# Patient Record
Sex: Female | Born: 1960 | Race: White | Hispanic: No | Smoking: Never smoker
Health system: Southern US, Community
[De-identification: ages and names within clinical notes are randomized; demographics above are authoritative.]

## PROBLEM LIST (undated history)

## (undated) DIAGNOSIS — D649 Anemia, unspecified: Secondary | ICD-10-CM

## (undated) DIAGNOSIS — Z5189 Encounter for other specified aftercare: Secondary | ICD-10-CM

## (undated) DIAGNOSIS — F329 Major depressive disorder, single episode, unspecified: Secondary | ICD-10-CM

## (undated) DIAGNOSIS — F32A Depression, unspecified: Secondary | ICD-10-CM

## (undated) DIAGNOSIS — F419 Anxiety disorder, unspecified: Secondary | ICD-10-CM

## (undated) HISTORY — PX: ABDOMINAL SURGERY: SHX537

## (undated) HISTORY — PX: NECK SURGERY: SHX720

## (undated) HISTORY — PX: TUBAL LIGATION: SHX77

## (undated) HISTORY — PX: CHOLECYSTECTOMY: SHX55

## (undated) HISTORY — PX: BACK SURGERY: SHX140

---

## 2013-10-13 DIAGNOSIS — Z5189 Encounter for other specified aftercare: Secondary | ICD-10-CM

## 2013-10-13 HISTORY — DX: Encounter for other specified aftercare: Z51.89

## 2013-11-06 ENCOUNTER — Emergency Department (HOSPITAL_COMMUNITY)
Admission: EM | Admit: 2013-11-06 | Discharge: 2013-11-07 | Disposition: A | Discharge status: Other Acute Inpatient Hospital | Payer: BC Managed Care – PPO | Attending: Emergency Medicine | Admitting: Emergency Medicine

## 2013-11-06 ENCOUNTER — Encounter (HOSPITAL_COMMUNITY): Payer: Self-pay | Admitting: Emergency Medicine

## 2013-11-06 DIAGNOSIS — F10939 Alcohol use, unspecified with withdrawal, unspecified: Secondary | ICD-10-CM | POA: Insufficient documentation

## 2013-11-06 DIAGNOSIS — F1910 Other psychoactive substance abuse, uncomplicated: Secondary | ICD-10-CM | POA: Insufficient documentation

## 2013-11-06 DIAGNOSIS — Z79899 Other long term (current) drug therapy: Secondary | ICD-10-CM | POA: Insufficient documentation

## 2013-11-06 DIAGNOSIS — F458 Other somatoform disorders: Secondary | ICD-10-CM | POA: Insufficient documentation

## 2013-11-06 DIAGNOSIS — F10239 Alcohol dependence with withdrawal, unspecified: Secondary | ICD-10-CM | POA: Insufficient documentation

## 2013-11-06 DIAGNOSIS — Z3202 Encounter for pregnancy test, result negative: Secondary | ICD-10-CM | POA: Insufficient documentation

## 2013-11-06 DIAGNOSIS — R45851 Suicidal ideations: Secondary | ICD-10-CM | POA: Insufficient documentation

## 2013-11-06 DIAGNOSIS — F32A Depression, unspecified: Secondary | ICD-10-CM

## 2013-11-06 DIAGNOSIS — F3289 Other specified depressive episodes: Secondary | ICD-10-CM | POA: Insufficient documentation

## 2013-11-06 DIAGNOSIS — F332 Major depressive disorder, recurrent severe without psychotic features: Secondary | ICD-10-CM | POA: Diagnosis present

## 2013-11-06 DIAGNOSIS — F329 Major depressive disorder, single episode, unspecified: Secondary | ICD-10-CM | POA: Insufficient documentation

## 2013-11-06 HISTORY — DX: Encounter for other specified aftercare: Z51.89

## 2013-11-06 LAB — COMPREHENSIVE METABOLIC PANEL
ALBUMIN: 4.2 g/dL (ref 3.5–5.2)
ALT: 120 U/L — AB (ref 0–35)
AST: 212 U/L — ABNORMAL HIGH (ref 0–37)
Alkaline Phosphatase: 85 U/L (ref 39–117)
BUN: 13 mg/dL (ref 6–23)
CHLORIDE: 94 meq/L — AB (ref 96–112)
CO2: 27 mEq/L (ref 19–32)
CREATININE: 0.45 mg/dL — AB (ref 0.50–1.10)
Calcium: 10.1 mg/dL (ref 8.4–10.5)
GFR calc Af Amer: >90 mL/min (ref >90)
GFR calc non Af Amer: >90 mL/min (ref >90)
Glucose, Bld: 119 mg/dL — ABNORMAL HIGH (ref 70–99)
Potassium: 4.3 mEq/L (ref 3.7–5.3)
SODIUM: 138 meq/L (ref 137–147)
Total Bilirubin: 0.7 mg/dL (ref 0.3–1.2)
Total Protein: 8.4 g/dL — ABNORMAL HIGH (ref 6.0–8.3)

## 2013-11-06 LAB — RAPID URINE DRUG SCREEN, HOSP PERFORMED
AMPHETAMINES: NOT DETECTED
BENZODIAZEPINES: NOT DETECTED
Barbiturates: NOT DETECTED
Cocaine: NOT DETECTED
OPIATES: NOT DETECTED
TETRAHYDROCANNABINOL: NOT DETECTED

## 2013-11-06 LAB — ACETAMINOPHEN LEVEL

## 2013-11-06 LAB — URINALYSIS, ROUTINE W REFLEX MICROSCOPIC
Bilirubin Urine: NEGATIVE
Glucose, UA: NEGATIVE mg/dL
HGB URINE DIPSTICK: NEGATIVE
KETONES UR: 15 mg/dL — AB
Leukocytes, UA: NEGATIVE
Nitrite: NEGATIVE
PROTEIN: NEGATIVE mg/dL
Specific Gravity, Urine: 1.019 (ref 1.005–1.030)
Urobilinogen, UA: 1 mg/dL (ref 0.0–1.0)
pH: 7 (ref 5.0–8.0)

## 2013-11-06 LAB — CBC
HCT: 32.2 % — ABNORMAL LOW (ref 36.0–46.0)
Hemoglobin: 10.1 g/dL — ABNORMAL LOW (ref 12.0–15.0)
MCH: 21 pg — ABNORMAL LOW (ref 26.0–34.0)
MCHC: 31.4 g/dL (ref 30.0–36.0)
MCV: 66.8 fL — AB (ref 78.0–100.0)
PLATELETS: 277 10*3/uL (ref 150–400)
RBC: 4.82 MIL/uL (ref 3.87–5.11)
RDW: 28.7 % — AB (ref 11.5–15.5)
WBC: 6.5 10*3/uL (ref 4.0–10.5)

## 2013-11-06 LAB — SALICYLATE LEVEL

## 2013-11-06 LAB — POCT PREGNANCY, URINE: Preg Test, Ur: NEGATIVE

## 2013-11-06 LAB — ETHANOL: Alcohol, Ethyl (B): <11 mg/dL (ref 0–11)

## 2013-11-06 MED ORDER — LORAZEPAM 1 MG PO TABS
0.0000 mg | ORAL_TABLET | Freq: Four times a day (QID) | ORAL | Status: DC
  Administered 2013-11-07: 2 mg via ORAL
  Filled 2013-11-06: qty 2

## 2013-11-06 MED ORDER — LORAZEPAM 1 MG PO TABS: 0.0000 mg | ORAL_TABLET | Freq: Two times a day (BID) | ORAL | Status: DC

## 2013-11-06 NOTE — ED Notes (Signed)
[facility] and [facility] for [facility] before detox from ETOH, last drank this morning approximately 18 pack of beer in the past 24 hours, pt states "I drank all week long." Pt denies any other drug use. Pt a&o x4, noted to be hypertensive and states "I've just had a rough week." Pt calm and cooperative at this time. Pt states she has SI with a plan to OD on pills and ETOH "if I had the right." Denies HI, or AVH.

## 2013-11-06 NOTE — ED Provider Notes (Addendum)
CSN: [id number]     Arrival date & time 11/06/13  1941 History  This chart was scribed for non-physician practitioner Earley FavorGail Charisa Twitty working with Layla MawKristen N Ward, DO by Carl Bestelina Holson, ED Scribe. This patient was seen in room WTR1/WLPT1 and the patient's care was started at 9:54 PM.     Chief Complaint  Patient presents with  . Medical Clearance    The history is provided by the patient. No language interpreter was used.   HPI Comments: Angela Barron is a 53 y.o. female who presents to the Emergency Department complaining of.  The patient states that [facility] and [facility] of depressive symptoms and SI.  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  She states that she has not taken Ambien since the suicide attempt and has since been in therapy.  She states that she sees her counselor twice a week.  She denies acting on her SI now and she does not currently have a plan.  She states that she is currently detoxing from alcohol and experiencing depression.      Past Medical History  Diagnosis Date  . Blood transfusion without reported diagnosis 10/13/2013    Hemoglobin 5.5   Past Surgical History  Procedure Laterality Date  . Cholecystectomy    . Back surgery      lumbar fusion  . Neck surgery    . Abdominal surgery      gastric bypass  . Tubal ligation     History reviewed. No pertinent family history. History  Substance Use Topics  . Smoking status: Never Smoker   . Smokeless tobacco: Never Used  . Alcohol Use: 7.2 oz/week    12 Cans of beer per week   OB History   Grav Para Term Preterm Abortions TAB SAB Ect Mult Living                 Review of Systems  Psychiatric/Behavioral: Positive for suicidal ideas.  All other systems reviewed and are negative.    Allergies  Macrobid  Home Medications   Current  Outpatient Rx  Name  Route  Sig  Dispense  Refill  . gabapentin (NEURONTIN) 300 MG capsule   Oral   Take 300 mg by mouth 3 (three) times daily.         . mirabegron ER (MYRBETRIQ) 50 MG TB24 tablet   Oral   Take 50 mg by mouth daily.         Marland Kitchen. omeprazole (PRILOSEC) 20 MG capsule   Oral   Take 20 mg by mouth 2 (two) times daily before a meal.          Triage Vitals: BP 184/87  Pulse 99  Temp(Src) 99.1 F (37.3 C) (Oral)  Resp 17  SpO2 99%  LMP 10/13/2013  Physical Exam  Nursing note and vitals reviewed. Constitutional: She is oriented to person, place, and time. She appears well-developed and well-nourished. No distress.  HENT:  Head: Normocephalic and atraumatic.  Eyes: EOM are normal.  Neck: Normal range of motion. Neck supple.  Cardiovascular: Normal rate and regular rhythm.   Pulmonary/Chest: Effort normal.  Musculoskeletal: Normal range of motion.  Neurological: She is alert and oriented to person, place, and time.  Skin: Skin is warm and dry.  Psychiatric: Her speech is normal and  behavior is normal. She expresses inappropriate judgment. She exhibits a depressed mood. She expresses suicidal ideation. She expresses suicidal plans.    ED Course  Procedures (including critical care time)  DIAGNOSTIC STUDIES: Oxygen Saturation is 99% on room air, normal by my interpretation.    COORDINATION OF CARE: 10:03 PM- Discussed allowing TSS to assess the patient and the patient agreed to the treatment plan.    Labs Review Labs Reviewed  CBC - Abnormal; Notable for the following:    Hemoglobin 10.1 (*)    HCT 32.2 (*)    MCV 66.8 (*)    MCH 21.0 (*)    RDW 28.7 (*)    All other components within normal limits  COMPREHENSIVE METABOLIC PANEL - Abnormal; Notable for the following:    Chloride 94 (*)    Glucose, Bld 119 (*)    Creatinine, Ser 0.45 (*)    Total Protein 8.4 (*)    AST 212 (*)    ALT 120 (*)    All other components within normal limits   SALICYLATE LEVEL - Abnormal; Notable for the following:    Salicylate Lvl <2.0 (*)    All other components within normal limits  URINALYSIS, ROUTINE W REFLEX MICROSCOPIC - Abnormal; Notable for the following:    APPearance CLOUDY (*)    Ketones, ur 15 (*)    All other components within normal limits  ACETAMINOPHEN LEVEL  ETHANOL  URINE RAPID DRUG SCREEN (HOSP PERFORMED)  POCT PREGNANCY, URINE   Imaging Review No results found.  EKG Interpretation   None       MDM  No diagnosis found.    I personally performed the services described in this documentation, which was scribed in my presence. The recorded information has been reviewed and is accurate. Labs have been reviewed.  Patient is cleared for psychiatric evaluation   Arman Filter, NP 11/06/13 2228  Arman Filter, NP 11/07/13 [phone]

## 2013-11-06 NOTE — ED Provider Notes (Signed)
Medical screening examination/treatment/procedure(s) were performed by non-physician practitioner and as supervising physician I was immediately available for consultation/collaboration.  EKG Interpretation   None         Kristen N Ward, DO 11/06/13 2301 

## 2013-11-06 NOTE — Progress Notes (Signed)
   CARE MANAGEMENT ED NOTE 11/06/2013  Patient:  Angela Barron,Angela Barron   Account Number:  [id number]  Date Initiated:  11/06/2013  Documentation initiated by:  Radford PaxFERRERO,Oluwasemilore Pascuzzi  Subjective/Objective Assessment:   Patient presents to Ed with depressive symptoms and SI     Subjective/Objective Assessment Detail:     Action/Plan:   Action/Plan Detail:   Anticipated DC Date:       Status Recommendation to Physician:   Result of Recommendation:    Other ED Services  Consult Working Plan    DC Planning Services  Other  PCP issues    Choice offered to / List presented to:            Status of service:  Completed, signed off  ED Comments:   ED Comments Detail:  Patient confirms her pcp is Dr. Marjory SneddonGregory Jones.  System updated.

## 2013-11-07 ENCOUNTER — Encounter (HOSPITAL_COMMUNITY): Payer: Self-pay | Admitting: *Deleted

## 2013-11-07 ENCOUNTER — Encounter (HOSPITAL_COMMUNITY): Payer: Self-pay | Admitting: Registered Nurse

## 2013-11-07 ENCOUNTER — Inpatient Hospital Stay (HOSPITAL_COMMUNITY)
Admission: AD | Admit: 2013-11-07 | Discharge: 2013-11-13 | DRG: 897 | Disposition: A | Discharge status: Home / Self Care | Payer: BC Managed Care – PPO | Source: Intra-hospital | Attending: Psychiatry | Admitting: Psychiatry

## 2013-11-07 DIAGNOSIS — F1020 Alcohol dependence, uncomplicated: Secondary | ICD-10-CM

## 2013-11-07 DIAGNOSIS — R45851 Suicidal ideations: Secondary | ICD-10-CM

## 2013-11-07 DIAGNOSIS — F329 Major depressive disorder, single episode, unspecified: Secondary | ICD-10-CM

## 2013-11-07 DIAGNOSIS — F332 Major depressive disorder, recurrent severe without psychotic features: Secondary | ICD-10-CM | POA: Diagnosis present

## 2013-11-07 DIAGNOSIS — F1994 Other psychoactive substance use, unspecified with psychoactive substance-induced mood disorder: Secondary | ICD-10-CM | POA: Diagnosis present

## 2013-11-07 DIAGNOSIS — D509 Iron deficiency anemia, unspecified: Secondary | ICD-10-CM | POA: Diagnosis present

## 2013-11-07 DIAGNOSIS — G4700 Insomnia, unspecified: Secondary | ICD-10-CM | POA: Diagnosis present

## 2013-11-07 DIAGNOSIS — G8929 Other chronic pain: Secondary | ICD-10-CM | POA: Diagnosis present

## 2013-11-07 DIAGNOSIS — F411 Generalized anxiety disorder: Secondary | ICD-10-CM | POA: Diagnosis present

## 2013-11-07 DIAGNOSIS — F3289 Other specified depressive episodes: Secondary | ICD-10-CM

## 2013-11-07 HISTORY — DX: Anemia, unspecified: D64.9

## 2013-11-07 HISTORY — DX: Anxiety disorder, unspecified: F41.9

## 2013-11-07 HISTORY — DX: Major depressive disorder, single episode, unspecified: F32.9

## 2013-11-07 HISTORY — DX: Depression, unspecified: F32.A

## 2013-11-07 MED ORDER — MAGNESIUM HYDROXIDE 400 MG/5ML PO SUSP: 30.0000 mL | Freq: Every day | ORAL | Status: DC | PRN

## 2013-11-07 MED ORDER — PANTOPRAZOLE SODIUM 40 MG PO TBEC: 40.0000 mg | DELAYED_RELEASE_TABLET | Freq: Every day | ORAL | Status: DC

## 2013-11-07 MED ORDER — CHLORDIAZEPOXIDE HCL 25 MG PO CAPS: 25.0000 mg | ORAL_CAPSULE | ORAL | Status: DC

## 2013-11-07 MED ORDER — HYDROXYZINE HCL 25 MG PO TABS
25.0000 mg | ORAL_TABLET | Freq: Four times a day (QID) | ORAL | Status: AC | PRN
  Administered 2013-11-07 – 2013-11-09 (×5): 25 mg via ORAL
  Filled 2013-11-07 (×5): qty 1

## 2013-11-07 MED ORDER — ONDANSETRON 4 MG PO TBDP: 4.0000 mg | ORAL_TABLET | Freq: Four times a day (QID) | ORAL | Status: DC | PRN

## 2013-11-07 MED ORDER — LOPERAMIDE HCL 2 MG PO CAPS
2.0000 mg | ORAL_CAPSULE | ORAL | Status: AC | PRN
Start: 2013-11-07 — End: 2013-11-10

## 2013-11-07 MED ORDER — CHLORDIAZEPOXIDE HCL 25 MG PO CAPS
25.0000 mg | ORAL_CAPSULE | ORAL | Status: AC
  Administered 2013-11-09 (×2): 25 mg via ORAL
  Filled 2013-11-07 (×2): qty 1

## 2013-11-07 MED ORDER — MIRABEGRON ER 50 MG PO TB24
50.0000 mg | ORAL_TABLET | Freq: Every day | ORAL | Status: DC
  Filled 2013-11-07: qty 1

## 2013-11-07 MED ORDER — ALUM & MAG HYDROXIDE-SIMETH 200-200-20 MG/5ML PO SUSP: 30.0000 mL | ORAL | Status: DC | PRN

## 2013-11-07 MED ORDER — ADULT MULTIVITAMIN W/MINERALS CH
1.0000 | ORAL_TABLET | Freq: Every day | ORAL | Status: DC
  Administered 2013-11-07 – 2013-11-13 (×7): 1 via ORAL
  Filled 2013-11-07 (×9): qty 1

## 2013-11-07 MED ORDER — ADULT MULTIVITAMIN W/MINERALS CH: 1.0000 | ORAL_TABLET | Freq: Every day | ORAL | Status: DC

## 2013-11-07 MED ORDER — CHLORDIAZEPOXIDE HCL 25 MG PO CAPS: 25.0000 mg | ORAL_CAPSULE | Freq: Four times a day (QID) | ORAL | Status: DC

## 2013-11-07 MED ORDER — GABAPENTIN 300 MG PO CAPS
300.0000 mg | ORAL_CAPSULE | Freq: Three times a day (TID) | ORAL | Status: DC
  Filled 2013-11-07 (×3): qty 1

## 2013-11-07 MED ORDER — CHLORDIAZEPOXIDE HCL 25 MG PO CAPS
25.0000 mg | ORAL_CAPSULE | Freq: Every day | ORAL | Status: AC
  Administered 2013-11-10: 25 mg via ORAL
  Filled 2013-11-07: qty 1

## 2013-11-07 MED ORDER — THIAMINE HCL 100 MG/ML IJ SOLN
100.0000 mg | Freq: Once | INTRAMUSCULAR | Status: AC
  Administered 2013-11-07: 100 mg via INTRAMUSCULAR
  Filled 2013-11-07: qty 2

## 2013-11-07 MED ORDER — PANTOPRAZOLE SODIUM 40 MG PO TBEC
40.0000 mg | DELAYED_RELEASE_TABLET | Freq: Every day | ORAL | Status: DC
  Administered 2013-11-07 – 2013-11-13 (×7): 40 mg via ORAL
  Filled 2013-11-07 (×9): qty 1

## 2013-11-07 MED ORDER — CHLORDIAZEPOXIDE HCL 25 MG PO CAPS
25.0000 mg | ORAL_CAPSULE | Freq: Four times a day (QID) | ORAL | Status: AC | PRN
Start: 2013-11-07 — End: 2013-11-10

## 2013-11-07 MED ORDER — HYDROXYZINE HCL 25 MG PO TABS: 25.0000 mg | ORAL_TABLET | Freq: Four times a day (QID) | ORAL | Status: DC | PRN

## 2013-11-07 MED ORDER — GABAPENTIN 300 MG PO CAPS
300.0000 mg | ORAL_CAPSULE | Freq: Three times a day (TID) | ORAL | Status: DC
  Administered 2013-11-07 – 2013-11-10 (×8): 300 mg via ORAL
  Filled 2013-11-07 (×10): qty 1

## 2013-11-07 MED ORDER — LOPERAMIDE HCL 2 MG PO CAPS: 2.0000 mg | ORAL_CAPSULE | ORAL | Status: DC | PRN

## 2013-11-07 MED ORDER — THIAMINE HCL 100 MG/ML IJ SOLN: 100.0000 mg | Freq: Once | INTRAMUSCULAR | Status: DC

## 2013-11-07 MED ORDER — ONDANSETRON 4 MG PO TBDP: 4.0000 mg | ORAL_TABLET | Freq: Four times a day (QID) | ORAL | Status: AC | PRN

## 2013-11-07 MED ORDER — VITAMIN B-1 100 MG PO TABS
100.0000 mg | ORAL_TABLET | Freq: Every day | ORAL | Status: DC
  Administered 2013-11-08 – 2013-11-13 (×6): 100 mg via ORAL
  Filled 2013-11-07 (×8): qty 1

## 2013-11-07 MED ORDER — CHLORDIAZEPOXIDE HCL 25 MG PO CAPS
25.0000 mg | ORAL_CAPSULE | Freq: Three times a day (TID) | ORAL | Status: AC
  Administered 2013-11-08 (×3): 25 mg via ORAL
  Filled 2013-11-07 (×3): qty 1

## 2013-11-07 MED ORDER — CHLORDIAZEPOXIDE HCL 25 MG PO CAPS
25.0000 mg | ORAL_CAPSULE | Freq: Four times a day (QID) | ORAL | Status: AC
  Administered 2013-11-07 (×3): 25 mg via ORAL
  Filled 2013-11-07 (×3): qty 1

## 2013-11-07 MED ORDER — CHLORDIAZEPOXIDE HCL 25 MG PO CAPS: 25.0000 mg | ORAL_CAPSULE | Freq: Every day | ORAL | Status: DC

## 2013-11-07 MED ORDER — CHLORDIAZEPOXIDE HCL 25 MG PO CAPS: 25.0000 mg | ORAL_CAPSULE | Freq: Three times a day (TID) | ORAL | Status: DC

## 2013-11-07 MED ORDER — ACETAMINOPHEN 325 MG PO TABS
650.0000 mg | ORAL_TABLET | Freq: Four times a day (QID) | ORAL | Status: DC | PRN
Start: 2013-11-07 — End: 2013-11-14
  Administered 2013-11-10: 650 mg via ORAL
  Filled 2013-11-07: qty 2

## 2013-11-07 MED ORDER — CHLORDIAZEPOXIDE HCL 25 MG PO CAPS: 25.0000 mg | ORAL_CAPSULE | Freq: Four times a day (QID) | ORAL | Status: DC | PRN

## 2013-11-07 MED ORDER — MIRABEGRON ER 50 MG PO TB24
50.0000 mg | ORAL_TABLET | Freq: Every day | ORAL | Status: DC
  Administered 2013-11-07 – 2013-11-13 (×7): 50 mg via ORAL
  Filled 2013-11-07 (×9): qty 1

## 2013-11-07 MED ORDER — VITAMIN B-1 100 MG PO TABS: 100.0000 mg | ORAL_TABLET | Freq: Every day | ORAL | Status: DC

## 2013-11-07 NOTE — ED Notes (Signed)
Security at bedside to wand pt. 

## 2013-11-07 NOTE — BH Assessment (Signed)
Assessment Note  Angela Barron is a 53 year old white female with suicidal ideation and a plan to overdose on medication.  [facility] evaluation of depressive symptoms and SI.  Patient reports that she has a plan to OD on pills and alcohol, "if I had the right amount of pills."  Patient is not able to contract for safety.   Patient reports a prior psychiatric hospitalization in two years ago by overdosing on Ambien before going to rehab for alcohol and prescription medication abuse. Patient reports that she has not taken Ambien since the suicide attempt and has since been in therapy.   Patient reports that she sees her counselor twice a week.   Patient reports that she is currently detoxing from alcohol and experiencing depression. Patient reports that her last drink was this morning.  Patient reports that she drank approximately 18 pack of beer in the past 24 hours.  Patient reports that, "I drank all week long." Patient denies any other drug use. Patient BAL is <11.  Patient UDS is negative. Patient denies HI, or AVH.    Axis I: Major Depression, Recurrent severe and Alcohol Dependence  Axis II: Deferred Axis III:  Past Medical History  Diagnosis Date  . Blood transfusion without reported diagnosis 10/13/2013    Hemoglobin 5.5   Axis IV: occupational problems, other psychosocial or environmental problems, problems related to social environment, problems with access to health care services and problems with primary support group Axis V: 31-40 impairment in reality testing  Past Medical History:  Past Medical History  Diagnosis Date  . Blood transfusion without reported diagnosis 10/13/2013    Hemoglobin 5.5    Past Surgical History  Procedure Laterality Date  . Cholecystectomy    . Back surgery      lumbar fusion  . Neck surgery    . Abdominal surgery      gastric bypass  . Tubal ligation      Family History: History reviewed.  No pertinent family history.  Social History:  reports that she has never smoked. She has never used smokeless tobacco. She reports that she drinks about 7.2 ounces of alcohol per week. She reports that she does not use illicit drugs.  Additional Social History:     CIWA: CIWA-Ar BP: 169/89 mmHg Pulse Rate: 90 Nausea and Vomiting: no nausea and no vomiting Tactile Disturbances: none Tremor: no tremor Auditory Disturbances: not present Paroxysmal Sweats: barely perceptible sweating, palms moist Visual Disturbances: not present Anxiety: moderately anxious, or guarded, so anxiety is inferred Headache, Fullness in Head: none present Agitation: somewhat more than normal activity Orientation and Clouding of Sensorium: oriented and can do serial additions CIWA-Ar Total: 6 COWS:    Allergies:  Allergies  Allergen Reactions  . Macrobid [Nitrofurantoin Monohyd Macro] Anaphylaxis and Hives    Home Medications:  (Not in a hospital admission)  OB/GYN Status:  Patient's last menstrual period was 10/13/2013.  General Assessment Data Location of Assessment: [facility] Is this a Tele or Face-to-Face Assessment?: Face-to-Face Is this an Initial Assessment or a Re-assessment for this encounter?: Initial Assessment Living Arrangements: Children (Living with her daughter ) Can pt return to current living arrangement?: Yes Admission Status: Voluntary Is patient capable of signing voluntary admission?: Yes Transfer from: Acute Hospital Referral Source: Self/Family/Friend  Medical Screening Exam [facility] Walk-in ONLY) Medical Exam completed: Yes  [facility] Living Arrangements: Children (Living with her daughter )  Name of Psychiatrist: Dr. Balinda Quails  Name of Therapist: Balinda Quails   Education Status Is patient currently in school?: No Current Grade: NA Highest grade of school patient has completed: NA Name of school: NA Contact person: NA  Risk to self Suicidal Ideation:  Yes-Currently Present Suicidal Intent: Yes-Currently Present Is patient at risk for suicide?: Yes Suicidal Plan?: Yes-Currently Present Specify Current Suicidal Plan: Overdose  Access to Means: Yes Specify Access to Suicidal Means: Pills What has been your use of drugs/alcohol within the last 12 months?: Alcohol  Previous Attempts/Gestures: Yes How many times?: 1 Other Self Harm Risks: 2010 Triggers for Past Attempts: Unpredictable Intentional Self Injurious Behavior: None Family Suicide History: No Recent stressful life event(s): Other (Comment) (Feeling overwhelmed) Persecutory voices/beliefs?: No Depression: Yes Depression Symptoms: Despondent;Insomnia;Tearfulness;Isolating;Fatigue;Loss of interest in usual pleasures;Feeling worthless/self pity;Guilt Substance abuse history and/or treatment for substance abuse?: Yes Suicide prevention information given to non-admitted patients: Yes  Risk to Others Homicidal Ideation: No Thoughts of Harm to Others: No Current Homicidal Intent: No Current Homicidal Plan: No Access to Homicidal Means: No Identified Victim: NA History of harm to others?: No Assessment of Violence: None Noted Violent Behavior Description: Calm Does patient have access to weapons?: No Criminal Charges Pending?: No Does patient have a court date: No  Psychosis Hallucinations: None noted Delusions: None noted  Mental Status Report Appear/Hygiene: Disheveled Eye Contact: Fair Motor Activity: Freedom of movement Speech: Logical/coherent Level of Consciousness: Alert Mood: Depressed Affect: Apprehensive Anxiety Level: Minimal Thought Processes: Coherent;Relevant Judgement: Unimpaired Orientation: Person;Place;Time;Situation Obsessive Compulsive Thoughts/Behaviors: None  Cognitive Functioning Concentration: Decreased Memory: Recent Intact;Remote Intact IQ: Average Insight: Fair Impulse Control: Poor Appetite: Poor Weight Loss: 10 Weight Gain:  0 Sleep: Decreased Total Hours of Sleep: 3 Vegetative Symptoms: Decreased grooming  ADLScreening [facility] Assessment Services) Patient's cognitive ability adequate to safely complete daily activities?: Yes Patient able to express need for assistance with ADLs?: Yes Independently performs ADLs?: Yes (appropriate for developmental age)  Prior Inpatient Therapy Prior Inpatient Therapy: Yes Prior Therapy Dates: 2010 Prior Therapy Facilty/Provider(s): [facility]  Reason for Treatment: Suicide attempt   Prior Outpatient Therapy Prior Outpatient Therapy: Yes Prior Therapy Dates: Ongoing  Prior Therapy Facilty/Provider(s): Dr. Elayne Guerin for medication management  Balinda Quails for Outpatient THerapy ) Reason for Treatment: Medication Management  (Outpatient Mental Health Therapy)  ADL Screening (condition at time of admission) Patient's cognitive ability adequate to safely complete daily activities?: Yes Patient able to express need for assistance with ADLs?: Yes Independently performs ADLs?: Yes (appropriate for developmental age)         Values / Beliefs Cultural Requests During Hospitalization: None Spiritual Requests During Hospitalization: None        Additional Information 1:1 In Past 12 Months?: No CIRT Risk: No Elopement Risk: No Does patient have medical clearance?: Yes     Disposition: [facility] Bed 507-1 per, Donell Sievert, PA Disposition Initial Assessment Completed for this Encounter: Yes Disposition of Patient: Inpatient treatment program Type of inpatient treatment program: Adult  On Site Evaluation by:   Reviewed with Physician:    Phillip Heal LaVerne 11/07/2013 4:50 AM
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
## 2013-11-07 NOTE — ED Notes (Signed)
Called for transport with [facility] transportation. Will continue to monitor.

## 2013-11-07 NOTE — [facility] Suicide Risk Assessment (Signed)
[facility] INPATIENT:  Family/Significant Other Suicide Prevention Education  Suicide Prevention Education:  Patient Discharged to Other Healthcare Facility:  Suicide Prevention Education Not Provided: {PT. DISCHARGED TO OTHER HEALTHCARE FACILITY:SUICIDE PREVENTION EDUCATION NOT PROVIDED (CHL):  The patient is discharging to another healthcare facility for continuation of treatment.  The patient's medical information, including suicide ideations and risk factors, are a part of the medical information shared with the receiving healthcare facility.  [facility]  Wynn BankerHodnett, Nahomi Hegner Hairston 11/07/2013, 3:31 PM

## 2013-11-07 NOTE — Progress Notes (Signed)
Pt observed in the dayroom interacting with peers.  Pt states she is new to the unit today, and is concerned about getting her home meds reordered, esp her Neurontin.  Pt was informed that the Neurontin was continued and pt was given an evening dose.  Pt denies SI/HI/AV.  Pt having minimal withdrawal symptoms at this time.  Pt was encouraged to make her needs known and to inform staff if her symptoms begin to increase.  [facility].  Support and encouragement offered.  Safety maintained with q15 minute checks.

## 2013-11-07 NOTE — Progress Notes (Signed)
Recreation Therapy Notes  Animal-Assisted Activity/Therapy (AAA/T) Program Checklist/Progress Notes Patient Eligibility Criteria Checklist & Daily Group note for Rec Tx Intervention  Date: 01.27.2015 Time: 2:45pm [facility]    AAA/T Program Assumption of Risk Form signed by Patient/ or Parent Legal Guardian no  Behavioral Response: Did not attend.    Marykay Lexenise L Simran Mannis, LRT/CTRS  Jearl KlinefelterBlanchfield, Lyndzie Zentz L 11/07/2013 5:32 PM

## 2013-11-07 NOTE — Progress Notes (Signed)
Adult Psychoeducational Group Note  Date:  11/07/2013 Time:  8:00 pm  Group Topic/Focus:  Wrap-Up Group:   The focus of this group is to help patients review their daily goal of treatment and discuss progress on daily workbooks.  Participation Level:  Active  Participation Quality:  Appropriate and Sharing  Affect:  Appropriate  Cognitive:  Appropriate  Insight: Appropriate  Engagement in Group:  Engaged  Modes of Intervention:  Discussion, Education, Socialization and Support  Additional Comments:  Pt stated that she has learned to decrease isolation and to ask for help. 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   Laural BenesJohnson, Faithe Ariola 11/07/2013, 10:19 PM

## 2013-11-07 NOTE — ED Notes (Signed)
[facility] Will call for transport and have pt tx per order. Will continue to monitor until left the facility.

## 2013-11-07 NOTE — Tx Team (Signed)
Initial Interdisciplinary Treatment Plan  PATIENT STRENGTHS: (choose at least two) Ability for insight Average or above average intelligence Capable of independent living Communication skills Financial means General fund of knowledge Motivation for treatment/growth Physical Health Supportive family/friends Work skills  PATIENT STRESSORS: Substance abuse   PROBLEM LIST: Problem List/Patient Goals Date to be addressed Date deferred Reason deferred Estimated date of resolution  Substance abuse 11/07/2013   D/c        Suicidal ideation 11/07/2013   D/c        Depression 11/07/2013   D/c                           DISCHARGE CRITERIA:  Ability to meet basic life and health needs Adequate post-discharge living arrangements Improved stabilization in mood, thinking, and/or behavior Medical problems require only outpatient monitoring Motivation to continue treatment in a less acute level of care Need for constant or close observation no longer present Reduction of life-threatening or endangering symptoms to within safe limits Safe-care adequate arrangements made Verbal commitment to aftercare and medication compliance Withdrawal symptoms are absent or subacute and managed without 24-hour nursing intervention  PRELIMINARY DISCHARGE PLAN: Attend aftercare/continuing care group Attend PHP/IOP Attend 12-step recovery group Outpatient therapy Return to previous living arrangement Return to previous work or school arrangements  PATIENT/FAMIILY INVOLVEMENT: This treatment plan has been presented to and reviewed with the patient, Angela Barron.  The patient and family have been given the opportunity to ask questions and make suggestions.  Quintella ReichertKnight, Marguerite Jarboe [place] ParkShephard 11/07/2013, 2:30 PM

## 2013-11-07 NOTE — Progress Notes (Signed)
Per, PA Donell Sievert(Spencer Simon) the patient meets criteria for inpatient hospitalization.  [facility]  Per, [facility] Deirdre Pippins([place]) [facility]  Dr. Elsie SaasJonnalagadda is accepting doctor.  Support [facility].   Writer informed the nurse Okey Regal(Carol) and the ER MD (Dr. Gwendolyn GrantWalden) of the patient disposition.  The nurse can call report to the adult unit at [phone].  The nurse will arrange transportation through [facility] [phone]).

## 2013-11-07 NOTE — ED Notes (Signed)
Psych MD and NP at bedside. 

## 2013-11-07 NOTE — Consult Note (Signed)
Face to face evaluation, note reviewed and agreed with 

## 2013-11-07 NOTE — ED Provider Notes (Signed)
Medical screening examination/treatment/procedure(s) were performed by non-physician practitioner and as supervising physician I was immediately available for consultation/collaboration.  EKG Interpretation   None         Layla MawKristen N Vauda Salvucci, DO 11/07/13 0120

## 2013-11-07 NOTE — [facility] Group Notes (Signed)
[facility] LCSW Group Therapy      Feelings About Diagnosis 1:15 - 2:30 PM            11/07/2013   Type of Therapy:  Group Therapy  Participation Level: Minimal  Participation Quality:  Appropriate  Affect:  Appropriate, Flat, Depressed  Cognitive:  Alert and Appropriate  Insight:  Developing/Improving  Engagement in Therapy:  Developing/Improving   Modes of Intervention:  Discussion, Education, Exploration, Problem-Solving, Rapport Building, Support  Summary of Progress/Problems:  Patient actively participated in group. Patient discussed past and present diagnosis and the effects it has had on  life.  Patient talked about family and society being judgmental and the stigma associated with having a mental health diagnosis.  Patient shared she wanted to listen but not engage in the discussion.  Patient new admission and allowed time to adjust to the milieu.  Wynn BankerHodnett, Shina Wass Hairston 11/07/2013

## 2013-11-07 NOTE — Consult Note (Signed)
  [facility] and requesting assistance with alcohol detox.  This morning patient states that there are no changes in the way she feels. [facility].  Will prepare patient for transfer.    Shuvon B. Rankin FNP-BC
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
## 2013-11-08 DIAGNOSIS — F1020 Alcohol dependence, uncomplicated: Secondary | ICD-10-CM | POA: Diagnosis present

## 2013-11-08 DIAGNOSIS — F332 Major depressive disorder, recurrent severe without psychotic features: Secondary | ICD-10-CM

## 2013-11-08 MED ORDER — ENSURE COMPLETE PO LIQD
237.0000 mL | Freq: Two times a day (BID) | ORAL | Status: DC
  Administered 2013-11-08 – 2013-11-11 (×3): 237 mL via ORAL

## 2013-11-08 MED ORDER — VENLAFAXINE HCL ER 75 MG PO CP24
75.0000 mg | ORAL_CAPSULE | Freq: Every day | ORAL | Status: DC
  Administered 2013-11-09 – 2013-11-13 (×5): 75 mg via ORAL
  Filled 2013-11-08 (×7): qty 1

## 2013-11-08 NOTE — Progress Notes (Signed)
Adult Psychoeducational Group Note  Date:  11/08/2013 Time:  2:24 PM  Group Topic/Focus:  Personal Choices and Values:   The focus of this group is to help patients assess and explore the importance of values in their lives, how their values affect their decisions, how they express their values and what opposes their expression.  Participation Level:  Active  Participation Quality:  Appropriate  Affect:  Appropriate  Cognitive:  Appropriate  Insight: Appropriate  Engagement in Group:  Engaged  Modes of Intervention:  Discussion  Additional Comments:  Pt. Participated and shared  Tonita CongMcLaurin, Kyrah Schiro L 11/08/2013, 2:24 PM

## 2013-11-08 NOTE — Progress Notes (Signed)
Adult Psychoeducational Group Note  Date:  11/08/2013 Time:  2:25 PM  Group Topic/Focus:  Personal Choices and Values:   The focus of this group is to help patients assess and explore the importance of values in their lives, how their values affect their decisions, how they express their values and what opposes their expression.  Participation Level:  Active  Participation Quality:  Appropriate  Affect:  Appropriate  Cognitive:  Appropriate  Insight: Appropriate  Engagement in Group:  Engaged  Modes of Intervention:  Discussion  Additional Comments:  Pt. participated and shared.  Tonita CongMcLaurin, Edmundo Tedesco L 11/08/2013, 2:25 PM

## 2013-11-08 NOTE — Progress Notes (Signed)
D:  Patient's self inventory sheet, patient has fair sleep, improving appetite, low energy level, improving attention span.  Rated depression 8, hopeless 7, anxiety 5.  Continues to experience cravings, agitation.  Denied SI.  Lightheaded, pain, black stools in past 24 hours.  Pain goal today 4, worst pain 7.  "Continue to see therapist, stay on meds, obtain from alcohol, clean my house.  Can we address my anemia?"  A:  Medications administered per MD orders.  Emotional support and encouragement given patient. R:  Denied SI and HI.  Denied A/V hallucinations.  Will continue to monitor patient for safety with 15 minute checks.  Safety maintained.
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
## 2013-11-08 NOTE — Progress Notes (Signed)
NUTRITION ASSESSMENT  Pt identified as at risk on the Malnutrition Screen Tool  INTERVENTION: 1. Educated patient on the importance of nutrition and encouraged intake of food and beverages. 2. Discussed weight goals. 3. Supplements: Ensure Complete po BID, each supplement provides 350 kcal and 13 grams of protein Thiamine and MVI daily  NUTRITION DIAGNOSIS: Unintentional weight loss related to sub-optimal intake as evidenced by pt report.   Goal: Pt to meet >/= 90% of their estimated nutrition needs.  Monitor:  PO intake  Assessment:  Patient admitted with depression, anxiety, and etoh abuse.  Patient reports poor appetite and nausea for the past 2 weeks secondary to stomach hurt from etoh abuse.  States that she received a blood transfusion for low iron January 2.  Reports that she is trying to eat better here.  Reports a 10 lb weight loss recently.  53 y.o. female  Height: Ht Readings from Last 1 Encounters:  11/07/13 5\' 2"  (1.575 m)    Weight: Wt Readings from Last 1 Encounters:  11/07/13 153 lb (69.4 kg)    Weight Hx: Wt Readings from Last 10 Encounters:  11/07/13 153 lb (69.4 kg)    BMI:  Body mass index is 27.98 kg/(m^2). Pt meets criteria for overweight based on current BMI.  Estimated Nutritional Needs: Kcal: 25-30 kcal/kg Protein: > 1 gram protein/kg Fluid: 1 ml/kcal  Diet Order: General Pt is also offered choice of unit snacks mid-morning and mid-afternoon.  Pt is eating as desired.   Lab results and medications reviewed.   Oran ReinLaura Kloe Oates, RD, LDN Clinical Inpatient Dietitian Pager:  [phone] Weekend and after hours pager:  [phone]

## 2013-11-08 NOTE — Progress Notes (Signed)
D Pt. Denies SI and HI, No complaints of pain or discomfort noted  A Writer offered support and encouragement.  Discussed coping skills with pt.  R Pt. Remains safe on the unit.  Pt. Has been asleep most of the evening stating the librium is helping her to relax and also making her sleepy.

## 2013-11-08 NOTE — [facility] Group Notes (Signed)
[facility]   11/08/2013 9:47 AM    Participation Quality:  Appropraite  Mood/Affect:  Appropriate  Depression Rating:  6  Anxiety Rating:  6  Thoughts of Suicide:  No  Will you contract for safety?   NA  Current AVH:  No  Plan for Discharge/Comments:  Patient attended discharge planning group and actively participated in group.  She reports doing well today.  [facility] CSW provided all participants with daily workbook.   Transportation Means: Patient has transportation.   Supports:  Patient has a support system.   Angela Barron, [name]

## 2013-11-08 NOTE — [facility] Group Notes (Signed)
[facility] LCSW Group Therapy  Emotional Regulation 1:15 - 2: 30 PM        11/08/2013  3:36 PM   Type of Therapy:  Group Therapy  Participation Level:  Appropriate  Participation Quality:  Appropriate  Affect:  Appropriate  Cognitive:  Attentive Appropriate  Insight:  Developing/Improving Engaged  Engagement in Therapy:  Developing/Improving Engaged  Modes of Intervention:  Discussion Exploration Problem-Solving Supportive  Summary of Progress/Problems:  Group topic was emotional regulations.  Patient participated in the discussion and was able to identify an emotion that needed to regulated.  She shared the emotions she deal with are feelings of failure and guilt.  Patient shared she feels like a failure because she her principal gives her a really difficult time. She noted all the positive comments go to staff who are younger than she.  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 Patient was able to identify approprite coping skills.  Wynn BankerHodnett, Vivion Romano Hairston 11/08/2013 3:36 PM

## 2013-11-08 NOTE — Progress Notes (Signed)
Adult Psychoeducational Group Note  Date:  11/08/2013 Time:  9:04 PM  Group Topic/Focus:  Wrap-Up Group:   The focus of this group is to help patients review their daily goal of treatment and discuss progress on daily workbooks.  Participation Level:  Active  Participation Quality:  Appropriate  Affect:  Appropriate  Cognitive:  Appropriate  Insight: Appropriate  Engagement in Group:  Engaged  Modes of Intervention:  Support  Additional Comments:  Pt stated that she really enjoyed group therapy today with her SW and that she now sees the importance of group therapy. She learned that she has to let go of the past and move on  Arriyana Rodell 11/08/2013, 9:04 PM
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
## 2013-11-08 NOTE — Tx Team (Signed)
Interdisciplinary Treatment Plan Update   Date Reviewed:  11/08/2013  Time Reviewed:  8:29 AM  Progress in Treatment:   Attending groups: Yes Participating in groups: Yes Taking medication as prescribed: Yes  Tolerating medication: Yes Family/Significant other contact made:  No consent given for collateral contact Patient understands diagnosis: Yes  Discussing patient identified problems/goals with staff: Yes Medical problems stabilized or resolved: Yes Denies suicidal/homicidal ideation: Yes Patient has not harmed self or others: Yes  For review of initial/current patient goals, please see plan of care.  Estimated Length of Stay:  3-5 days  Reasons for Continued Hospitalization:  Anxiety Depression Medication stabilization   New Problems/Goals identified:    Discharge Plan or Barriers:   Home with outpatient follow up with [facility]  Additional Comments:  Angela Binderaula Whitfield is a 53 year old white female with suicidal ideation and a plan to overdose on medication. Patient was brought to the ER from [facility] today for an evaluation of depressive symptoms and SI. Patient reports that she has a plan to OD on pills and alcohol, "if I had the right amount of pills." Patient is not able to contract for safety.   Patient reports a prior psychiatric hospitalization in two years ago by overdosing on Ambien before going to rehab for alcohol and prescription medication abuse. Patient reports that she has not taken Ambien since the suicide attempt and has since been in therapy.   Attendees:  Patient:  11/08/2013 8:29 AM   Signature: Mervyn GayJ. Jonnalagadda, MD 11/08/2013 8:29 AM  Signature:   11/08/2013 8:29 AM  Signature:  Claudette Headonrad Withrow, NP 11/08/2013 8:29 AM  Signature:Beverly Terrilee CroakKnight, RN 11/08/2013 8:29 AM  Signature:  Neill Loftarol Davis RN 11/08/2013 8:29 AM  Signature:  Juline PatchQuylle Lashayla Armes, LCSW 11/08/2013 8:29 AM  Signature:  Reyes Ivanhelsea Horton, LCSW 11/08/2013 8:29 AM  Signature:  Leisa LenzValerie Enoch, Care  Coordinator 11/08/2013 8:29 AM  Signature:  Aloha GellKrista Dopson, RN 11/08/2013 8:29 AM  Signature:  11/08/2013  8:29 AM  Signature:   Onnie BoerJennifer Clark, RN [facility] 11/08/2013  8:29 AM  Signature:  Harold Barbanonecia Byrd, RN 11/08/2013  8:29 AM    Scribe for Treatment Team:   Juline PatchQuylle Tammra Pressman,  11/08/2013 8:29 AM

## 2013-11-09 DIAGNOSIS — F10239 Alcohol dependence with withdrawal, unspecified: Secondary | ICD-10-CM

## 2013-11-09 DIAGNOSIS — F1994 Other psychoactive substance use, unspecified with psychoactive substance-induced mood disorder: Secondary | ICD-10-CM

## 2013-11-09 DIAGNOSIS — F10939 Alcohol use, unspecified with withdrawal, unspecified: Secondary | ICD-10-CM

## 2013-11-09 DIAGNOSIS — F1010 Alcohol abuse, uncomplicated: Secondary | ICD-10-CM

## 2013-11-09 LAB — FERRITIN: Ferritin: 11 ng/mL (ref 10–291)

## 2013-11-09 LAB — VITAMIN B12: VITAMIN B 12: 456 pg/mL (ref 211–911)

## 2013-11-09 LAB — FOLATE RBC: RBC FOLATE: 882 ng/mL — AB (ref >280)

## 2013-11-09 MED ORDER — FERROUS SULFATE 325 (65 FE) MG PO TABS
325.0000 mg | ORAL_TABLET | Freq: Every day | ORAL | Status: DC
  Administered 2013-11-09 – 2013-11-13 (×5): 325 mg via ORAL
  Filled 2013-11-09 (×8): qty 1

## 2013-11-09 MED ORDER — TRAZODONE HCL 50 MG PO TABS
50.0000 mg | ORAL_TABLET | Freq: Every evening | ORAL | Status: DC | PRN
  Administered 2013-11-09 – 2013-11-12 (×6): 50 mg via ORAL
  Filled 2013-11-09 (×13): qty 1

## 2013-11-09 NOTE — Progress Notes (Signed)
D:  Patient's self inventory sheet, patient has poor sleep, needs sleep medication, improving appetite, low energy level, improving attention span.  Rated depression 4, hopeless 3, anxiety 4-5.  Has experienced cravings, agitation in past 24 hours.  Denied SI.  Has experienced lightheadedness, pain, headache in past 24 hours.  Pain goal today 4, worst pain 6-8.  "Use support system, continue seeing therapist, follow up with primary care physician so I can continue my meds, obstain from alcohol.  Yes, I am concerned with my hemoglobin being low after having a transfusion on 10/13/13.  I am having black stools."    [facility].  No problems taking meds after discharge. A:  Medications administered per MD orders.  Emotional support and encouragement given patient. R:  Denied SI and HI.  Denied A/V hallucinations.  Will continue to monitor patient for safety with 15 minute checks.  Safety maintained.   Patient declined pain medication this morning for back and right lower leg pain.

## 2013-11-09 NOTE — Progress Notes (Signed)
  D: Pt observed sleeping in bed with eyes closed. RR even and unlabored. No distress noted  .  A: Q 15 minute checks were done for safety.  R: safety maintained on unit.  

## 2013-11-09 NOTE — Progress Notes (Signed)
The focus of this group is to educate the patient on the purpose and policies of crisis stabilization and provide a format to answer questions about their admission.  The group details unit policies and expectations of patients while admitted.  Patient attended 0900 nurse education orientation group this morning.  Patient actively participated, appropriate affect, alert, appropriate insight and engagement.  Today patient will work on 3 goals for discharge.  
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
## 2013-11-09 NOTE — Progress Notes (Signed)
Adult Psychoeducational Group Note  Date:  11/09/2013 Time:  2000  Group Topic/Focus:  Wrap-Up Group:   The focus of this group is to help patients review their daily goal of treatment and discuss progress on daily workbooks.  Participation Level:  Minimal  Participation Quality:  Appropriate  Affect:  Flat  Cognitive:  Appropriate  Insight: Good  Engagement in Group:  Limited  Modes of Intervention:  Exploration and Support  Additional Comments:  Pt shared that her therapeutic leisure activities included watching 'The [employer] Carole Binning[employer]ang Theory' and spending time with family.   Humberto SealsWhitaker, Waynette Towers Monique 11/09/2013, 10:25 PM

## 2013-11-09 NOTE — Progress Notes (Signed)
Adult Psychoeducational Group Note  Date:  11/09/2013 Time:  1:45 PM  Group Topic/Focus:  Stages of Change:   The focus of this group is to explain the stages of change and help patients identify changes they want to make upon discharge.  Participation Level:  Active  Participation Quality:  Appropriate  Affect:  Appropriate  Cognitive:  Appropriate  Insight: Appropriate  Engagement in Group:  Improving  Modes of Intervention:  Discussion  Additional Comments:  Pt. Participated in group. Pt stated that enjoys finding the joy in something  Tonita CongMcLaurin, Chelesea Weiand L 11/09/2013, 1:45 PM

## 2013-11-09 NOTE — Progress Notes (Signed)
D: Pt denies SI/HI/AVH. Pt is pleasant and cooperative. [facility] Pt stated she has been feeling better. Pt still has the nerve pain.   A: Pt was offered support and encouragement. Pt was given scheduled medications. Pt was encourage to attend groups. Q 15 minute checks were done for safety.   R:Pt attends groups and interacts well with peers and staff. Pt is taking medication. Pt receptive to treatment and safety maintained on unit.

## 2013-11-09 NOTE — Progress Notes (Signed)
Recreation Therapy Notes  Date: 01.29.2015 Time: 2:45pm Location: [address] Dayroom   Group Topic: Leisure Education  Goal Area(s) Addresses:  Patient will identify positive leisure activities.  Patient will identify one positive benefit of participation in leisure activities.   Behavioral Response: Engaged, Attentive, Appropriate   Intervention: Game  Activity: Leisure ABC's. Patients were asked to identify a leisure activity to correspond with each letter of the alphabet. Group discussion focused on the use of leisure as a coping mechanism, as well as a way to build support system post d/c.   Education:  Leisure Education, Building control surveyorDischarge Planning, Coping Skills   Education Outcome: Acknowledges understanding  Clinical Observations/Feedback: Patient actively engaged in developing group list. Patient contributed to group discussion, identifying positive emotion associated with participation in leisure activities, as well as applying the use of leisure to her life and the potential benefit she could gain from leisure participation. Patient actively listened to group discussion and points raised by peers.    Marykay Lexenise L Anginette Espejo, LRT/CTRS  Myrakle Wingler L 11/09/2013 5:06 PM

## 2013-11-09 NOTE — [facility] Group Notes (Signed)
[facility] LCSW Group Therapy  [facility] 1:15 - 2:30 PM  11/09/2013   Type of Therapy:  Group Therapy  Participation Level:  Minimal  Participation Quality:  Minimal  Affect:  Appropriate  Cognitive:  Appropriate  Insight:  Developing/Improving   Engagement in Therapy:  Developing/Improving   Modes of Intervention:  Discussion, Education, Exploration, Problem-Solving, Rapport Building, Support   Summary of Progress/Problems:  Patient listened  to speaker from [facility] but made no comments on the presentation.   Wynn BankerHodnett, Nazeer Romney Hairston 11/09/2013

## 2013-11-10 MED ORDER — GABAPENTIN 400 MG PO CAPS
400.0000 mg | ORAL_CAPSULE | Freq: Three times a day (TID) | ORAL | Status: DC
  Administered 2013-11-10 – 2013-11-11 (×4): 400 mg via ORAL
  Filled 2013-11-10 (×10): qty 1

## 2013-11-10 NOTE — Progress Notes (Signed)
D: Pt denies SI/HI/AVH. Pt is pleasant and cooperative. [facility]. Pt stated the Neurotin increase seems to be helping with the pain in her foot.   A: Pt was offered support and encouragement. Pt was given scheduled medications. Pt was encourage to attend groups. Q 15 minute checks were done for safety.   R:Pt attends groups and interacts well with peers and staff. Pt is taking medication.Pt receptive to treatment and safety maintained on unit.

## 2013-11-10 NOTE — [facility] Group Notes (Signed)
[facility]   11/10/2013 9:51 AM    Participation Quality:  Appropraite  Mood/Affect:  Appropriate  Depression Rating:  3/4  Anxiety Rating:  5  Thoughts of Suicide:  No  Will you contract for safety?   NA  Current AVH:  No  Plan for Discharge/Comments:  Patient attended discharge planning group and actively participated in group.  She reports having problems with pain overnight.  [facility]  CSW provided all participants with daily workbook.   Transportation Means: Patient has transportation.   Supports:  Patient has a support system.   Jayden Rudge, [name]

## 2013-11-10 NOTE — Progress Notes (Signed)
Adult Psychoeducational Group Note  Date:  11/10/2013 Time:  9:34 PM  Group Topic/Focus:  Wrap-Up Group:   The focus of this group is to help patients review their daily goal of treatment and discuss progress on daily workbooks.  Participation Level:  Active  Participation Quality:  Appropriate  Affect:  Appropriate  Cognitive:  Appropriate  Insight: Appropriate  Engagement in Group:  Engaged  Modes of Intervention:  Discussion  Additional Comments:  The patient expressed that she learn from  group  to use her support system.  Octavio Mannshigpen, Jizel Cheeks Lee 11/10/2013, 9:34 PM

## 2013-11-10 NOTE — Progress Notes (Signed)
Patient ID: Angela Barron, female   DOB: 19-Aug-1961, 53 y.o.   MRN: [id number] [facility] MD Progress Note  11/10/2013 11:47 AM Kelce Bouton  MRN:  [id number] Subjective:  Angela Barron is a 53 year old white female with depression, anxiety and suicidal ideation with a plan to overdose on medication. Patient has a planned admission to the [facility] for inpatient [facility] services but not emotionally stable at the time of admission.  Patient stated she has been feeling better and rates depression at 3/10 and anxiety at 5/10. Patient has been contracting for safety and denies acute suicidal ideation today. Will patient reported she has periods of of anxiety which is usually associated with her nerve pain. Patient has better sleep and appetite. Patient has been in contact with her daughter yesterday.  Patient has been active participating in unit activities including groups and therapies. Patient labs were pending at this time for evaluating anemia. Patient has anemic (iron-deficiency); she was started on iron replacement per protocol as iron was only 11 out of 291.    Diagnosis:   DSM5: Substance/Addictive Disorders:  Alcohol Withdrawal with Perceptural Disturbances (Z30.865)) Depressive Disorders:  Major Depressive Disorder - Severe (296.23) Total Time spent with patient: Greater than 30 minutes  Axis I: Alcohol Abuse, Major Depression, Recurrent severe and Substance Induced Mood Disorder Axis II: Deferred Axis III:  Past Medical History  Diagnosis Date  . Blood transfusion without reported diagnosis 10/13/2013    Hemoglobin 5.5  . Anxiety   . Depression   . Anemia    Axis IV: other psychosocial or environmental problems and problems related to social environment Axis V: 41-50 serious symptoms  ADL's:  Intact  Sleep: Fair  Appetite:  Good  Suicidal Ideation:  Denies Homicidal Ideation:  Denies AEB (as evidenced by):  Psychiatric Specialty Exam: Physical Exam  Review  of Systems  Constitutional: Negative.   HENT: Negative.   Eyes: Negative.   Respiratory: Negative.   Cardiovascular: Negative.   Gastrointestinal: Negative.   Genitourinary: Negative.   Musculoskeletal: Negative.   Skin: Negative.   Neurological: Negative.   Endo/Heme/Allergies: Negative.   Psychiatric/Behavioral: Negative.     Blood pressure 123/85, pulse 92, temperature 98 F (36.7 C), temperature source Oral, resp. rate 16, height 5\' 2"  (1.575 m), weight 69.4 kg (153 lb), last menstrual period 10/13/2013, SpO2 99.00%.Body mass index is 27.98 kg/(m^2).  General Appearance: Casual  Eye Contact::  Good  Speech:  Clear and Coherent  Volume:  Normal  Mood:  Euthymic  Affect:  Appropriate  Thought Process:  Coherent  Orientation:  Full (Time, Place, and Person)  Thought Content:  WDL  Suicidal Thoughts:  No  Homicidal Thoughts:  No  Memory:  Immediate;   Good Recent;   Good Remote;   Good  Judgement:  Fair  Insight:  Fair  Psychomotor Activity:  Normal  Concentration:  Good  Recall:  Good  Fund of Knowledge:Good  Language: Good  Akathisia:  NA  Handed:  Right  AIMS (if indicated):     Assets:  Communication Skills Desire for Improvement Resilience  Sleep:  Number of Hours: 6.25   Musculoskeletal: Strength & Muscle Tone: within normal limits Gait & Station: normal Patient leans: N/A  Current Medications: Current Facility-Administered Medications  Medication Dose Route Frequency Provider Last Rate Last Dose  . acetaminophen (TYLENOL) tablet 650 mg  650 mg Oral Q6H PRN Shuvon Rankin, NP   650 mg at 11/10/13 1051  . alum & mag hydroxide-simeth (MAALOX/MYLANTA) 200-200-20  MG/5ML suspension 30 mL  30 mL Oral Q4H PRN Shuvon Rankin, NP      . feeding supplement (ENSURE COMPLETE) (ENSURE COMPLETE) liquid 237 mL  237 mL Oral BID BM Jeoffrey MassedLaura Lee Jobe, RD   237 mL at 11/10/13 1049  . ferrous sulfate tablet 325 mg  325 mg Oral Q breakfast Beau FannyJohn C Withrow, FNP   325 mg at 11/10/13  0816  . gabapentin (NEURONTIN) capsule 300 mg  300 mg Oral TID Kerry HoughSpencer E Simon, PA-C   300 mg at 11/10/13 0815  . magnesium hydroxide (MILK OF MAGNESIA) suspension 30 mL  30 mL Oral Daily PRN Shuvon Rankin, NP      . mirabegron ER (MYRBETRIQ) tablet 50 mg  50 mg Oral Daily Shuvon Rankin, NP   50 mg at 11/10/13 0815  . multivitamin with minerals tablet 1 tablet  1 tablet Oral Daily Shuvon Rankin, NP   1 tablet at 11/10/13 0815  . pantoprazole (PROTONIX) EC tablet 40 mg  40 mg Oral Daily Shuvon Rankin, NP   40 mg at 11/10/13 0816  . thiamine (VITAMIN B-1) tablet 100 mg  100 mg Oral Daily Shuvon Rankin, NP   100 mg at 11/10/13 0816  . traZODone (DESYREL) tablet 50 mg  50 mg Oral QHS,MR X 1 Beau FannyJohn C Withrow, FNP   50 mg at 11/09/13 2144  . venlafaxine XR (EFFEXOR-XR) 24 hr capsule 75 mg  75 mg Oral Q breakfast Nehemiah SettleJanardhaha R Samani Deal, MD   75 mg at 11/10/13 16100816    Lab Results:  Results for orders placed during the hospital encounter of 11/07/13 (from the past 48 hour(s))  FERRITIN     Status: None   Collection Time    11/08/13  8:24 PM      Result Value Range   Ferritin 11  10 - 291 ng/mL   Comment: Performed at [facility]  VITAMIN B12     Status: None   Collection Time    11/08/13  8:24 PM      Result Value Range   Vitamin B-12 456  211 - 911 pg/mL   Comment: Performed at [facility]  FOLATE RBC     Status: Abnormal   Collection Time    11/08/13  8:24 PM      Result Value Range   RBC Folate 882 (*) >280 ng/mL   Comment: Reference range not established for pediatric patients.     Performed at [facility]    Physical Findings: AIMS: Facial and Oral Movements Muscles of Facial Expression: None, normal Lips and Perioral Area: None, normal Jaw: None, normal Tongue: None, normal,Extremity Movements Upper (arms, wrists, hands, fingers): None, normal Lower (legs, knees, ankles, toes): None, normal, Trunk Movements Neck, shoulders, hips: None, normal,  Overall Severity Severity of abnormal movements (highest score from questions above): None, normal Incapacitation due to abnormal movements: None, normal Patient's awareness of abnormal movements (rate only patient's report): No Awareness, Dental Status Current problems with teeth and/or dentures?: No Does patient usually wear dentures?: No  CIWA:  CIWA-Ar Total: 0 COWS:  COWS Total Score: 2  Treatment Plan Summary: Daily contact with patient to assess and evaluate symptoms and progress in treatment Medication management for depression and substance induced mood disorder and anemia. Patient also has a chronic pain syndrome.  Plan: Review of chart, vital signs, medications, and notes.  1-Individual and group therapy  2-Medication management for depression and anxiety: Medications reviewed with the patient. Increase Neurontin 400  mg 3 times a day for neuropathic pain and continue Vistaril 25mg  q6h PRN for anxiety, Trazodone 50mg  qhs PRN for sleep, Ferrous Sulfate 325mg  po Qbreakfast for microcytic iron-deficiency anemia (confirmed by labwork).  3-Coping skills for depression, anxiety  4-Continue crisis stabilization and management  5-Address health issues--monitoring vital signs, stable  6-Treatment plan in progress to prevent relapse of depression and anxiety 7- Check TIBC labs 11/10/13 in evening to determine iron-binding deficiency as well following first 2 doses of ferrous sulfate. If TIBC is high, will increase ferrous sulfate 325mg  to bid w/breakfast/dinner.  Medical Decision Making Problem Points:  Established problem, stable/improving (1), New problem, with additional work-up planned (4), Review of last therapy session (1) and Review of psycho-social stressors (1) Data Points:  Review or order clinical lab tests (1) Review or order medicine tests (1) Review of medication regiment & side effects (2) Review of new medications or change in dosage (2)  I certify that inpatient  services furnished can reasonably be expected to improve the patient's condition.    Sidnie Swalley,JANARDHAHA R. 11/10/2013 11:47 AM

## 2013-11-10 NOTE — Progress Notes (Signed)
Recreation Therapy Notes  Date: 01.30.2015 Time: 2:45pm Location: [address] Dayroom   Group Topic: Communication, Team Building, Problem Solving  Goal Area(s) Addresses:  Patient will effectively work with peer towards shared goal.  Patient will identify skill used to make activity successful.  Patient will identify how skills used during activity can be used to reach post d/c goals.   Behavioral Response:  Appropriate, Drowsy   Intervention: Problem Solving Activitiy  Activity: Life Boat. Patients were given a scenario about being on a sinking yacht. Patients were informed the yacht included 15 guest, 8 of which could be placed on the life boat, along with all group members. Individuals on guest list were of varying socioeconomic classes such as a Education officer, museumriest, Materials engineerresident Obama, MidwifeBus Driver, Tree surgeonTeacher and Chef.   Education: Pharmacist, communityocial Skills, Discharge Planning   Education Outcome: Acknowledges understanding  Clinical Observations/Feedback: Patient attended group and was observed to listen to group activity and discussion, but struggled to stay awake for session.     Marykay Lexenise L Eliot Popper, LRT/CTRS  Jearl KlinefelterBlanchfield, Youa Deloney L 11/10/2013 5:17 PM

## 2013-11-10 NOTE — Tx Team (Signed)
Interdisciplinary Treatment Plan Update   Date Reviewed:  11/10/2013  Time Reviewed:  8:45 AM  Progress in Treatment:   Attending groups: Yes Participating in groups: Yes Taking medication as prescribed: Yes  Tolerating medication: Yes Family/Significant other contact made:  No consent given for collateral contact Patient understands diagnosis: Yes  Discussing patient identified problems/goals with staff: Yes Medical problems stabilized or resolved: Yes Denies suicidal/homicidal ideation: Yes Patient has not harmed self or others: Yes  For review of initial/current patient goals, please see plan of care.  Estimated Length of Stay:  3 days  Reasons for Continued Hospitalization:  Anxiety Depression Medication stabilization   New Problems/Goals identified:    Discharge Plan or Barriers:   [facility]  Additional Comments: N/A Attendees:  Patient:  11/10/2013 8:45 AM   Signature: Mervyn GayJ. Jonnalagadda, MD 11/10/2013 8:45 AM  Signature:   11/10/2013 8:45 AM  Signature:  Silverio DecampJamison Lloyd, NP 11/10/2013 8:45 AM  Signature: Waynetta SandyJan Wright, RN 11/10/2013 8:45 AM  Signature:  Nestor Ramproy Duncan, RN 11/10/2013 8:45 AM  Signature:  Juline PatchQuylle Ashrita Chrismer, LCSW 11/10/2013 8:45 AM  Signature:  Reyes Ivanhelsea Horton, LCSW 11/10/2013 8:45 AM  Signature:  Leisa LenzValerie Enoch, Care Coordinator 11/10/2013 8:45 AM  Signature:   11/10/2013 8:45 AM  Signature:  11/10/2013  8:45 AM  Signature:   Onnie BoerJennifer Clark, RN [facility] 11/10/2013  8:45 AM  Signature:  Harold Barbanonecia Byrd, RN 11/10/2013  8:45 AM    Scribe for Treatment Team:   Juline PatchQuylle Coralie Stanke,  11/10/2013 8:45 AM

## 2013-11-10 NOTE — Progress Notes (Signed)
D:Pt rates depression as a 4 on 1-10 scale with 10 being the most depressed. She c/o difficulty sleeping last night waking up during the night with nerve pain in her foot. Pt is attending groups interacting with staff and peers. [facility] and continue to see her therapist. A:Offered support, encouragement and 15 minute checks. R:Pt denies si and hi. Safety maintained on the unit.

## 2013-11-10 NOTE — [facility] Group Notes (Signed)
[facility] LCSW Group Therapy  Feelings Around Relapse 1:15 -2:30        11/10/2013   Type of Therapy:  Group Therapy  Participation Level:  Appropriate  Participation Quality:  Appropriate  Affect:  Appropriate  Cognitive:  Attentive Appropriate  Insight:  Developing/Improving  Engagement in Therapy: Developing/Improving  Modes of Intervention:  Discussion Exploration Problem-Solving Supportive  Summary of Progress/Problems:  The topic for today was feelings around relapse.    Patient processed feelings toward relapse and was able to relate to peers.Patient shared relapse for her would be drinking.   Patient identified coping skills that can be used to prevent a relapse.   Wynn BankerHodnett, Braylynn Ghan Hairston 11/10/2013

## 2013-11-11 DIAGNOSIS — F411 Generalized anxiety disorder: Secondary | ICD-10-CM

## 2013-11-11 LAB — IRON AND TIBC
IRON: 14 ug/dL — AB (ref 42–135)
SATURATION RATIOS: 3 % — AB (ref 20–55)
TIBC: 435 ug/dL (ref 250–470)
UIBC: 421 ug/dL — ABNORMAL HIGH (ref 125–400)

## 2013-11-11 MED ORDER — GABAPENTIN 400 MG PO CAPS
500.0000 mg | ORAL_CAPSULE | Freq: Three times a day (TID) | ORAL | Status: DC
  Administered 2013-11-11 – 2013-11-13 (×7): 500 mg via ORAL
  Filled 2013-11-11 (×13): qty 1

## 2013-11-11 NOTE — [facility] Group Notes (Signed)
[facility] Group Notes:  (Nursing/MHT/Case Management/Adjunct)  Date:  11/11/2013  Time:  9:57 AM  Type of Therapy:  Group Therapy  Participation Level:  Active  Participation Quality:  Appropriate  Affect:  Appropriate  Cognitive:  Alert  Insight:  Appropriate  Engagement in Group:  Engaged  Modes of Intervention:  Discussion  Summary of Progress/Problems:Pt stated she was feeling good today and was trying not,"to feel useless."She is going to continue to work on this.  Rodman KeyWebb, Lidwina Kaner [facility] 11/11/2013, 9:57 AM

## 2013-11-11 NOTE — Progress Notes (Signed)
Adult Psychoeducational Group Note  Date:  11/11/2013 Time:  4:10 PM  Group Topic/Focus:  Emotional Education:   The focus of this group is to discuss what feelings/emotions are, and how they are experienced.  Participation Level:  Active  Participation Quality:  Appropriate  Affect:  Appropriate  Cognitive:  Appropriate  Insight: Appropriate  Engagement in Group:  Engaged  Modes of Intervention:  Activity and Education  Additional Comments:  Patient took the [employer] Languages Quiz from the packet and got Quality of Time as her language. Patient said it was not surprising.  Merleen MillinerCataldo, Lanetra Hartley Y 11/11/2013, 4:10 PM
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
## 2013-11-11 NOTE — Progress Notes (Signed)
Adult Psychoeducational Group Note  Date:  11/11/2013 Time:  8:00 pm  Group Topic/Focus:  Wrap-Up Group:   The focus of this group is to help patients review their daily goal of treatment and discuss progress on daily workbooks.  Participation Level:  Active  Participation Quality:  Appropriate and Sharing  Affect:  Appropriate  Cognitive:  Appropriate  Insight: Appropriate  Engagement in Group:  Engaged  Modes of Intervention:  Discussion, Education, Socialization and Support  Additional Comments:  Pt stated that she needs to continue seeing her therapist, continue taking her medications and decrease isolation to assist her with maintaining a healthy lifestyle in the future.   Cordel Drewes 11/11/2013, 9:06 PM

## 2013-11-11 NOTE — [facility] Group Notes (Signed)
[facility] Group Notes:  (Nursing/MHT/Case Management/Adjunct)  Date:  11/11/2013  Time:  11:02 AM  Type of Therapy:  Nurse Education  Participation Level:  Active  Participation Quality:  Appropriate  Affect:  Appropriate  Cognitive:  Appropriate  Insight:  Appropriate  Engagement in Group:  Engaged  Modes of Intervention:  Discussion  Summary of Progress/Problems:  Angela Barron 11/11/2013, 11:02 AM 

## 2013-11-11 NOTE — Progress Notes (Signed)
Writer has observed patient up in the dayroom watching tv and interacting with peers. She attended group this evening and participated. [facility] and [facility] that she could [facility] there tomorrow. She voiced no complaints and reports no withdrawal symptoms. She currently denies si/hi/a/v hallucinations. Support and encouragement given. Safety maintained on unit with 15 min checks.

## 2013-11-11 NOTE — Progress Notes (Signed)
Pt has been attending groups today. She rates her depression a 3./10 and her hopelessness a 2/10. Pt does feel her leg and back pain have been under control today although she still walks with a limp. She would like to seeher therapist regularly and use her family more for support. Pt would like to find joy and do things that bring happiness to her life. Pt. [facility]. She denies SI or Hi and contracts for safety.

## 2013-11-11 NOTE — [facility] Group Notes (Signed)
[facility] Group Notes:  (Clinical Social Work)  11/11/2013   1:15-2:15PM  Summary of Progress/Problems:   The main focus of today's process group was for the patient to identify ways in which they have sabotaged their own mental health wellness/recovery.  Motivational interviewing and a handout were used to explore the benefits and costs of their self-sabotaging behavior as well as the benefits and costs of changing this behavior.  The Stages of Change were explained to the group using a handout, and patients identified where they are with regard to changing self-defeating behaviors.  The patient expressed she self-sabotages with isolation that eventually leads to her drinking, mostly to deal with her physical pain.  When asked the benefit of this, she stated she blacks out and does not feel the pain.  The cost is that "I am here."  Type of Therapy:  Process Group  Participation Level:  Active  Participation Quality:  Attentive  Affect:  Blunted and Depressed  Cognitive:  Appropriate and Oriented  Insight:  Developing/Improving  Engagement in Therapy:  Engaged  Modes of Intervention:  Education, Motivational Interviewing   Angela MantleMareida Grossman-Orr, LCSW 11/11/2013, 4:00pm
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
## 2013-11-12 NOTE — [facility] Group Notes (Signed)
[facility] Group Notes:  (Clinical Social Work)  11/12/2013   1:15-2:15PM  Summary of Progress/Problems:  The main focus of today's process group was to   identify the patient's current support system and decide on other supports that can be put in place.  The picture on workbook was used to discuss why additional supports are needed.  An emphasis was placed on using counselor, doctor, therapy groups, 12-step groups, and problem-specific support groups to expand supports.   There was also an extensive discussion about what constitutes a healthy support versus an unhealthy support.  The patient expressed full comprehension of the concepts presented, and agreed that there is a need to add more supports.  At first she stated that she has support in place and just has to use it, i.e. to call a friend.  When the group discussed "willpower alone" not being sufficient, it was suggested that she make a plan for what to do if in fact the willpower to get help fails and she does not call the friend.  Another suggestion was making an arrangement with her friends or family that if she does not answer their texts or calls for a certain period of time, they are to come to her house to check on her.  She liked this idea a great deal and said she would call her friend before the end of this day, which CSW told her was her homework.  She also is interested in bicycling and swimming as supportive activities.  Type of Therapy:  Process Group  Participation Level:  Active  Participation Quality:  Attentive and Sharing  Affect:  Blunted  Cognitive:  Appropriate and Oriented  Insight:  Engaged  Engagement in Therapy:  Engaged  Modes of Intervention:  Education,  Support and ConAg[employer] FoodsProcessing  Breklyn Fabrizio Grossman-Orr, LCSW 11/12/2013, 4:00pm

## 2013-11-12 NOTE — Progress Notes (Signed)
Angela Barron is seen OOB UAL on the unit today, tolerated well.  She has attended her groups and is rngaged in her recovery.   A She takes her meds as ordered, she completes her AM self inventory, rating her depression and hopelessness "2/3', SHE DENIES si WITHIN THE PAST 24 HRS AND SHE SAYS HER dc PLAN IS " TO USE COPING SKILLS, CONT WITH THERAPY, MEDS EXERCISE AND EAT PROPERLY."   r sAFETY IS IN PLACE AND POC MAINTAIEND.

## 2013-11-12 NOTE — Progress Notes (Signed)
Adult Psychoeducational Group Note  Date:  11/12/2013 Time:  3:36 PM  Group Topic/Focus:  Making Healthy Choices:   The focus of this group is to help patients identify negative/unhealthy choices they were using prior to admission and identify positive/healthier coping strategies to replace them upon discharge.  Participation Level:  Did Not Attend  Additional Comments:  Pt was upset after receiving news from a family member.   Tora PerchesMercer, Dariela Stoker N 11/12/2013, 3:36 PM

## 2013-11-12 NOTE — [facility] Group Notes (Signed)
[facility] Group Notes:  (Nursing/MHT/Case Management/Adjunct)  Date:  11/12/2013  Time:  2:05 PM  Type of Therapy:  Psychoeducational Skills  Participation Level:  Active  Participation Quality:  Appropriate  Affect:  Appropriate  Cognitive:  Appropriate  Insight:  Appropriate  Engagement in Group:  engaged  Modes of Intervention:  Discussion, Education and Exploration  Summary of Progress/Problems: focus of this group was on identifying healthy support systems to aid in recovery with mental illness.   Angela Barron, Angela Barron 11/12/2013, 2:05 PM

## 2013-11-13 MED ORDER — TRAZODONE HCL 50 MG PO TABS: 50.0000 mg | ORAL_TABLET | Freq: Every evening | ORAL | Status: AC | PRN

## 2013-11-13 MED ORDER — VENLAFAXINE HCL ER 75 MG PO CP24: 75.0000 mg | ORAL_CAPSULE | Freq: Every day | ORAL | Status: AC

## 2013-11-13 MED ORDER — FERROUS SULFATE 325 (65 FE) MG PO TABS: 325.0000 mg | ORAL_TABLET | Freq: Every day | ORAL | Status: AC

## 2013-11-13 MED ORDER — GABAPENTIN 100 MG PO CAPS: 500.0000 mg | ORAL_CAPSULE | Freq: Three times a day (TID) | ORAL | Status: AC

## 2013-11-13 NOTE — Progress Notes (Signed)
[facility] :  Will you be returning to the same living situation after discharge: No.  [facility] At discharge, do you have transportation home?:Yes,  [facility] to [facility] patient to their facility. Do you have the ability to pay for your medications:Yes,  Patient is able to obtain medicaitons.  Release of information consent forms completed and in the chart;  Patient's signature needed at discharge.  Patient to Follow up at: Follow-up Information   [facility].   Contact information:   [address] [place], [place]   [address]  [phone]      Follow [facility] with [facility]. (Patient requesting discharge clinicals be sent to counselor)       [facility].   Contact information:   Patient requesting information be faxed to PCP.      Follow up with [facility] On 11/13/2013. (Monday, November 13, 2013 at 7:00.Patient will be transported [facility] Staff)    Contact information:   [address] [place], [place]   [id number]   [phone]      Patient denies SI/HI:  Patient no longer endorsing SI/HI or other thoughts of self harm.    Safety Planning and Suicide Prevention discussed: .Reviewed with all patients during discharge planning group   Marina Boerner, [name] 11/13/2013, 3:37 PM

## 2013-11-13 NOTE — [facility] Group Notes (Signed)
[facility]   11/13/2013 8:45 AM  Participation Quality:  Alert, Appropriate and Oriented  Mood/Affect:  Anxious  Depression Rating:  1  Anxiety Rating:  3  Thoughts of Suicide:  Pt denies SI/HI  Will you contract for safety?   Yes  Current AVH:  Pt denies  Plan for Discharge/Comments:  Pt attended discharge planning group and actively participated in group.  CSW provided pt with today's workbook.  Pt is anxious to find out [facility].  CSW waiting to hear back from [facility] if [facility] was [facility] to come there today.  No further needs voiced by pt at this time.    Transportation Means: Pt reports access to transportation - [facility]  Supports: No supports mentioned at this time  Reyes IvanChelsea Horton, LCSW 11/13/2013 10:03 AM

## 2013-11-13 NOTE — Progress Notes (Signed)
Patient has been up and active on the unit, watching the game, laughing and talking with peers. Patient currently denies having pain, -si/hi/a/v hall. [facility]. Support and encouragement offered, safety maintained on unit, will continue to monitor.

## 2013-11-13 NOTE — Progress Notes (Signed)
Recreation Therapy Notes  Date: 02.02.2015 Time: 2:45pm Location: [address] Dayroom   Group Topic: Wellness  Goal Area(s) Addresses:  Patient will define components of whole wellness. Patient will verbalize benefit of whole wellness.  Behavioral Response: Appropriate   Intervention: Mind Map  Activity: Patients were provided a worksheet with a bubble chart. As a group patients were asked to define the dimensions of wellness - Physical, Mental, Emotional, Social, Intellectual, Environmental, Leisure, and Spiritual. Patients were then asked to identify examples of ways they can invest in each dimension of wellness.   Education: Wellness, Building control surveyorDischarge Planning, Coping Skills   Education Outcome: Acknowledges understanding  Clinical Observations/Feedback: Patient actively engaged in group session, defining with peers dimensions of wellness. Patient additionally identified examples of dimensions of wellness with peers in group. Patient made no contributions to group session, but appeared to actively listen as she maintained appropriate eye contact with speaker.   Marykay Lexenise L Catrina Fellenz, LRT/CTRS  Jearl KlinefelterBlanchfield, Candra Wegner L 11/13/2013 4:56 PM

## 2013-11-13 NOTE — Progress Notes (Signed)
Patient ID: Angela Barron, female   DOB: 07/17/1961, 53 y.o.   MRN: [id number] She has been discharged and [facility].  All belongs were taken with her. She denise thoughts of harming self.

## 2013-11-13 NOTE — Progress Notes (Addendum)
D:  Patient's self inventory sheet, patient has poor sleep, good appetite, normal energy level, good attention span.  Rated depression 2, hopeless and anxiety 3.  Denied withdrawals.  Denied SI.  Has experienced pain, headache in past 24 hours.  Pain goal 3.  Worst pain 3-7.  After discharge, plans to use coping skills, keep seeing my therapist, take meds, find joy using hobbies and exercise.   Does have discharge plans.  No problems taking meds after discharge. A:  Medications administered per MD orders.  Emotional support and encouragement given patient. R:  Denied SI and HI, contracts for safety.  Will continue to monitor patient for safety with 15 minute checks.  Safety maintained. [facility]
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
## 2013-11-13 NOTE — BHH Suicide Risk Assessment (Signed)
   Demographic Factors:  Adolescent or young adult, Caucasian, Low socioeconomic status and Unemployed  Total Time spent with patient: 30 minutes  Psychiatric Specialty Exam: Physical Exam  Review of Systems  All other systems reviewed and are negative.    Blood pressure 133/89, pulse 86, temperature 97.4 F (36.3 C), temperature source Oral, resp. rate 18, height 5\' 2"  (1.575 m), weight 69.4 kg (153 lb), last menstrual period 10/13/2013, SpO2 99.00%.Body mass index is 27.98 kg/(m^2).  General Appearance: Fairly Groomed  Patent attorneyye Contact::  Good  Speech:  Clear and Coherent  Volume:  Normal  Mood:  Anxious  Affect:  Constricted  Thought Process:  Goal Directed and Intact  Orientation:  Full (Time, Place, and Person)  Thought Content:  Rumination  Suicidal Thoughts:  No  Homicidal Thoughts:  No  Memory:  Immediate;   Fair  Judgement:  Good   Insight:  Fair  Psychomotor Activity:  Normal  Concentration:  Fair  Recall:  Fair  Fund of Knowledge:Good  Language: Good  Akathisia:  NA  Handed:  Right  AIMS (if indicated):     Assets:  Communication Skills Desire for Improvement Financial Resources/Insurance Physical Health Resilience Social Support Transportation Vocational/Educational  Sleep:  Number of Hours: 5.75    Musculoskeletal: Strength & Muscle Tone: within normal limits Gait & Station: normal Patient leans: N/A   Mental Status Per Nursing Assessment::   On Admission:  Belief that plan would result in death  Current Mental Status by Physician: Patient is calm and cooperative. Patient has normal psychomotor activity. Patient has normal speech and thought process. Patient has no suicidal, homicidal ideation, intention or plan. Patient has no evidence of psychotic symptoms. Patient has fair insight, judgment and impulse control.  Loss Factors: Decrease in vocational status  Historical Factors: Impulsivity  Risk Reduction Factors:   Sense of responsibility to  family, Religious beliefs about death, Positive social support, Positive therapeutic relationship and Positive coping skills or problem solving skills  Continued Clinical Symptoms:  Depression:   Recent sense of peace/wellbeing Alcohol/Substance Abuse/Dependencies  Cognitive Features That Contribute To Risk:  Polarized thinking    Suicide Risk:  Minimal: No identifiable suicidal ideation.  Patients presenting with no risk factors but with morbid ruminations; may be classified as minimal risk based on the severity of the depressive symptoms  Discharge Diagnoses:   AXIS I:  Major Depression, Recurrent severe and Alcohol dependence AXIS II:  Deferred AXIS III:   Past Medical History  Diagnosis Date  . Blood transfusion without reported diagnosis 10/13/2013    Hemoglobin 5.5  . Anxiety   . Depression   . Anemia    AXIS IV:  other psychosocial or environmental problems, problems related to social environment and problems with primary support group AXIS V:  61-70 mild symptoms  Plan Of Care/Follow-up recommendations:  Activity:  As tolerated Diet:  Regular  Is patient on multiple antipsychotic therapies at discharge:  No   Has Patient had three or more failed trials of antipsychotic monotherapy by history:  No  Recommended Plan for Multiple Antipsychotic Therapies: NA    Angela Barron,Angela R. 11/13/2013, 4:17 PM
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
## 2013-11-13 NOTE — Clinical Social Work Note (Signed)
Writer spoke with [facility] and was advised they could not accept patient due to Hemogloblin being to low.  Patient consent for Clinical research associatewriter to [facility].  Writer [facility].  [facility] for [facility].  [facility] around 19:00 this evening.

## 2013-11-16 NOTE — Progress Notes (Signed)
Patient Discharge Instructions:  After Visit Summary (AVS):   Faxed to:  11/16/13 Discharge Summary Note:   Faxed to:  11/16/13 Psychiatric Admission Assessment Note:   Faxed to:  11/16/13 Suicide Risk Assessment - Discharge Assessment:   Faxed to:  11/16/13 Faxed/Sent to the Next Level Care provider:  11/16/13 [facility]axed to [facility] @ [phone] Faxed to [facility] @ [phone] Faxed to [facility] [place] @ [phone]  Jerelene ReddenSheena E [place], 11/16/2013, 3:47 PM

## 2015-09-24 ENCOUNTER — Other Ambulatory Visit: Payer: Self-pay | Admitting: *Deleted

## 2015-09-24 ENCOUNTER — Ambulatory Visit
Admission: RE | Admit: 2015-09-24 | Discharge: 2015-09-24 | Disposition: A | Discharge status: Home / Self Care | Payer: BC Managed Care – PPO | Source: Ambulatory Visit | Attending: *Deleted | Admitting: *Deleted

## 2015-09-24 DIAGNOSIS — R52 Pain, unspecified: Secondary | ICD-10-CM

## 2016-10-12 IMAGING — CR DG ANKLE COMPLETE 3+V*R*
3 series · 3 of 3 positions shown · non-contrast
Comparison: None in PACs

CLINICAL DATA: Possible injury of the right foot and ankle 9-10
days ago; persistent pain swelling and bruising centered over the
first and second metatarsals with some numbness ; history of
metatarsal fractures in 2947

EXAM:
RIGHT FOOT COMPLETE - 3+ VIEW; RIGHT ANKLE - COMPLETE 3+ VIEW

[t ankle joint ap right]
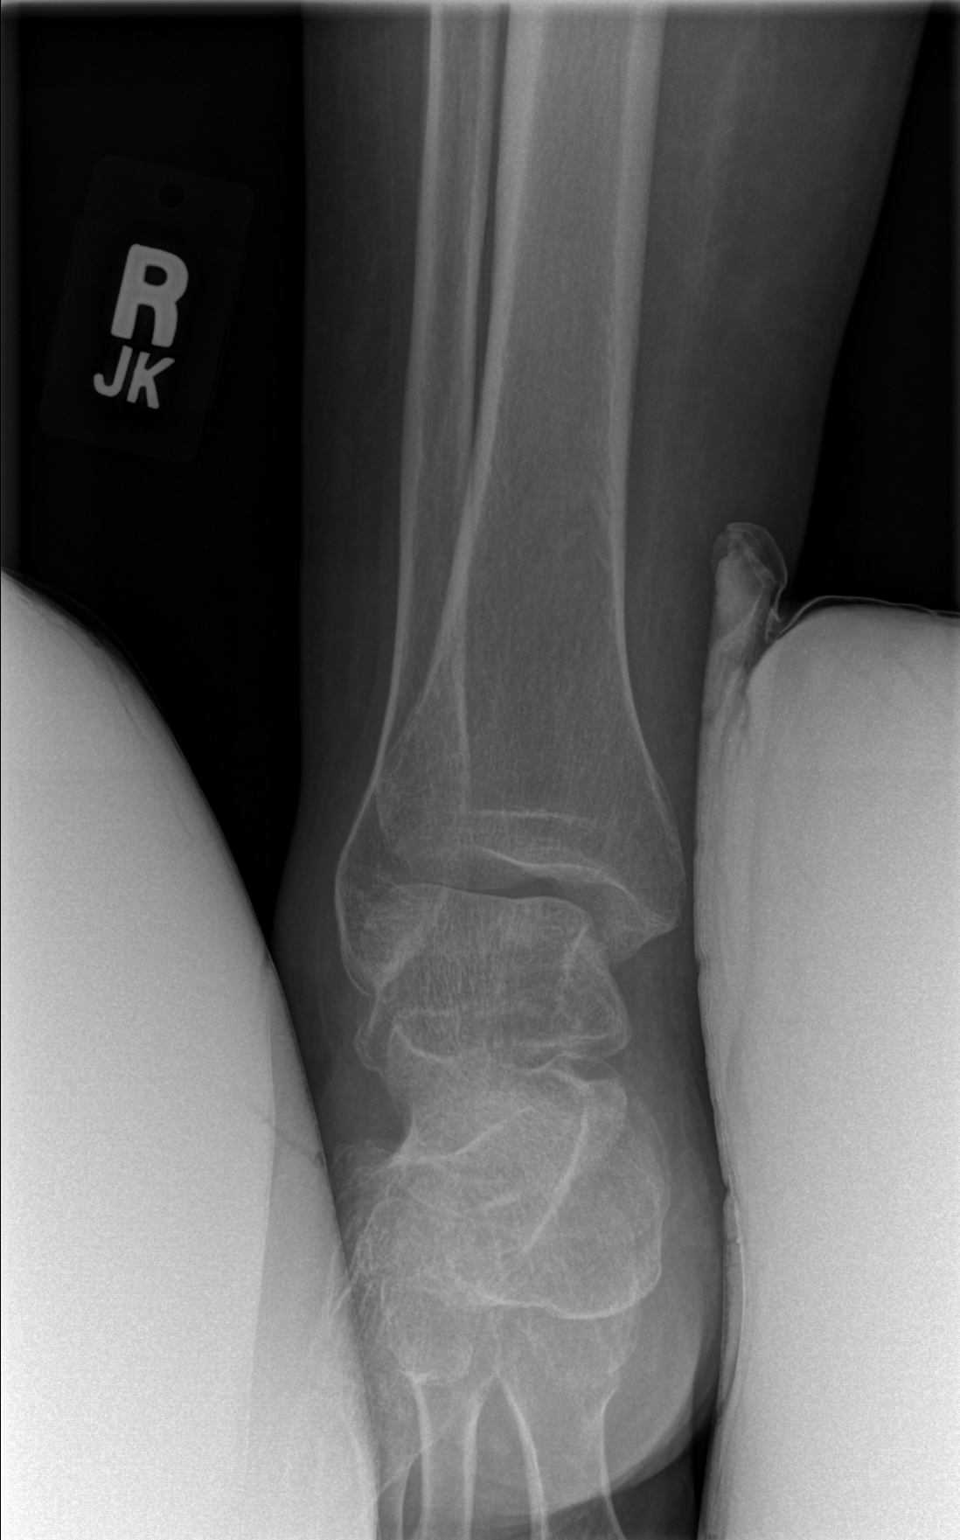

[t ankle joint oblique right]
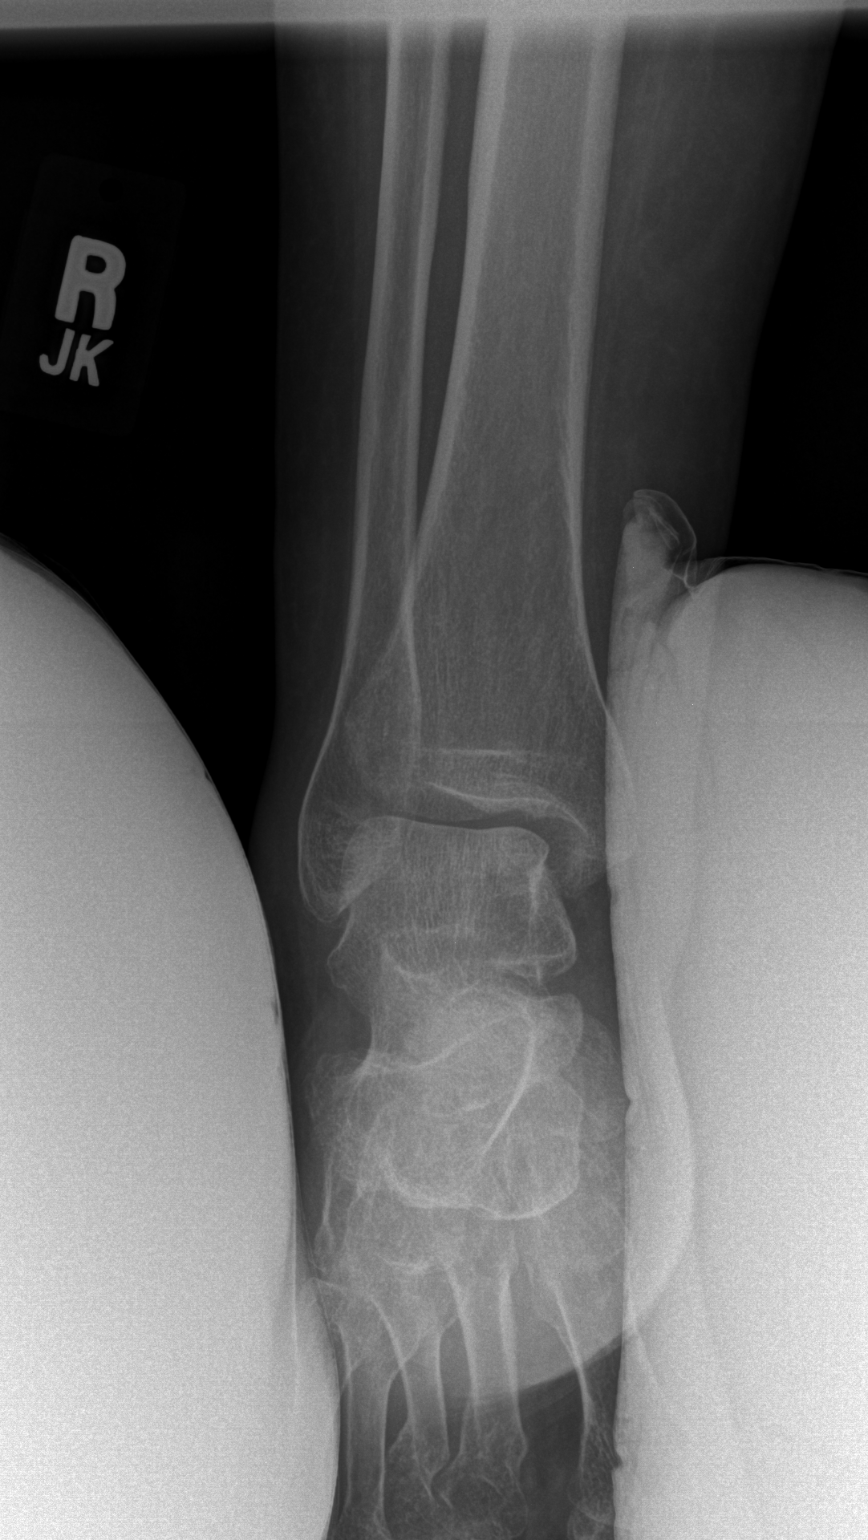

[t ankle joint lat right]
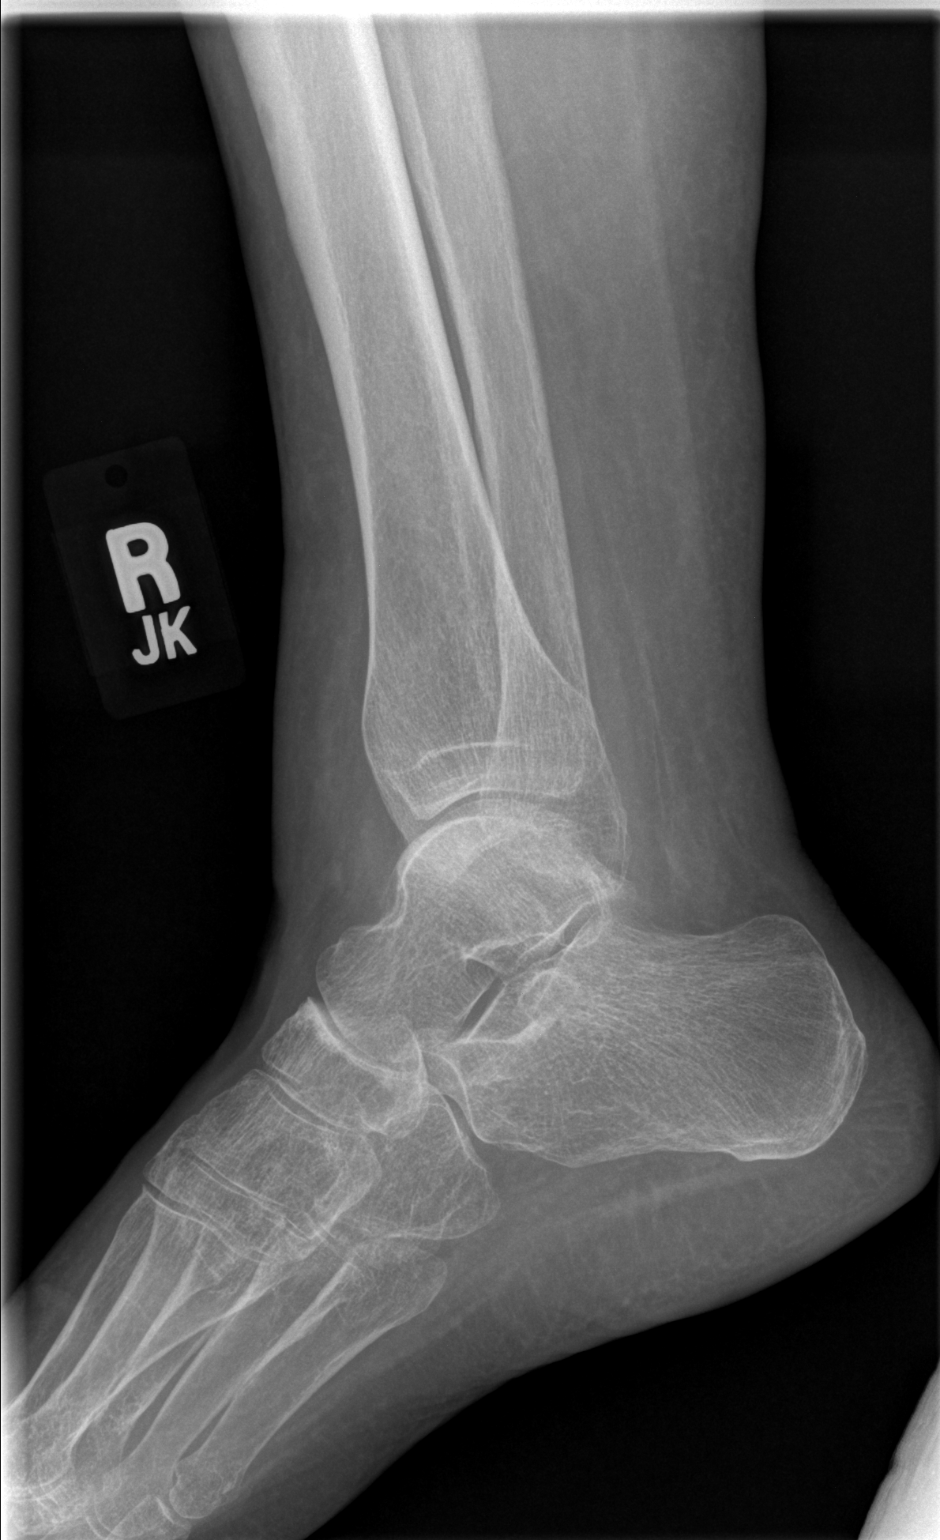

[3 of 3 positions shown; findings below may reference images not displayed]

FINDINGS: Right foot: The bones are mildly osteopenic. The phalanges are
intact. The interphalangeal joint spaces are mildly narrowed
diffusely. There is old deformity of the distal thirds of the shafts
of the second and third metatarsals consistent with the history
previous fractures. The MTP joints are preserved. The
tarsometatarsal joints and the intertarsal joints are unremarkable.
The talus and calcaneus are intact. There is mild soft tissue
swelling over the dorsum of the midfoot.

Right ankle: The ankle joint mortise is preserved. The talar dome is
intact. There is no acute malleolar fracture. There is soft tissue
swelling diffusely.
IMPRESSION: 1. There is no acute fracture of the bones of the right foot. There
is old deformity of the distal aspects of the shafts of the second
and third metatarsals. There is mild degenerative narrowing of the
interphalangeal joints diffusely.
2. There is no acute bony abnormality of the right ankle. There is
diffuse soft tissue swelling.

## 2016-10-12 IMAGING — CR DG FOOT COMPLETE 3+V*R*
3 series · 3 of 3 positions shown · non-contrast
Comparison: None in PACs

CLINICAL DATA: Possible injury of the right foot and ankle 9-10
days ago; persistent pain swelling and bruising centered over the
first and second metatarsals with some numbness ; history of
metatarsal fractures in 2947

EXAM:
RIGHT FOOT COMPLETE - 3+ VIEW; RIGHT ANKLE - COMPLETE 3+ VIEW

[t foot ap right]
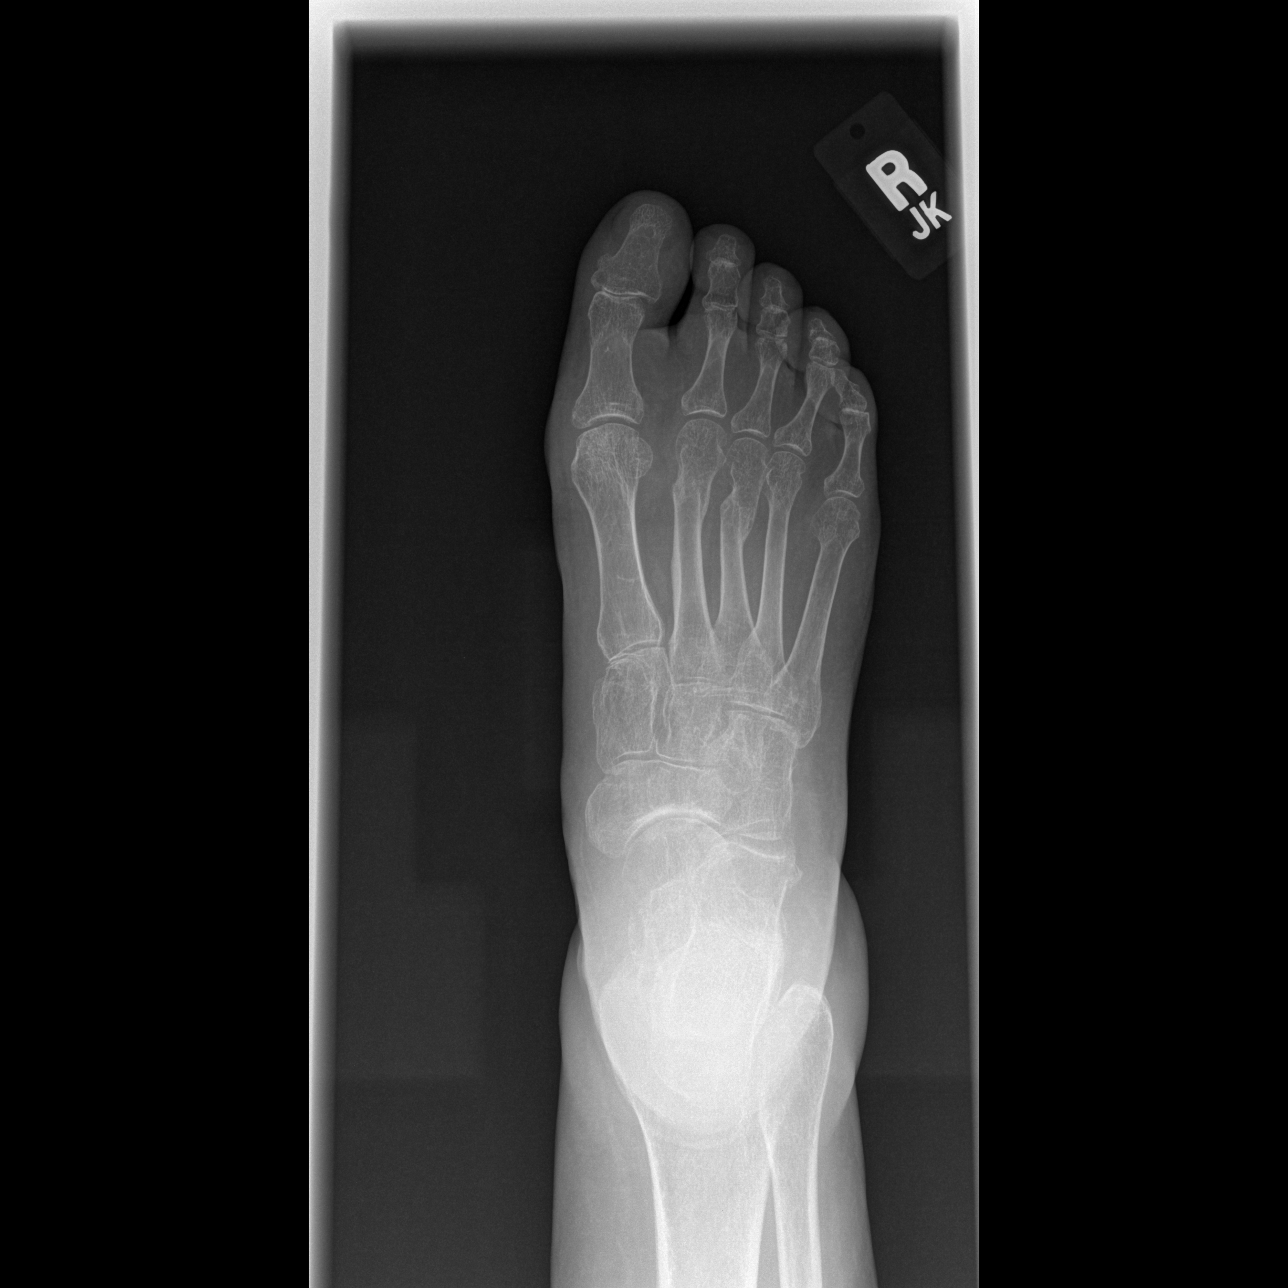

[t foot oblique right]
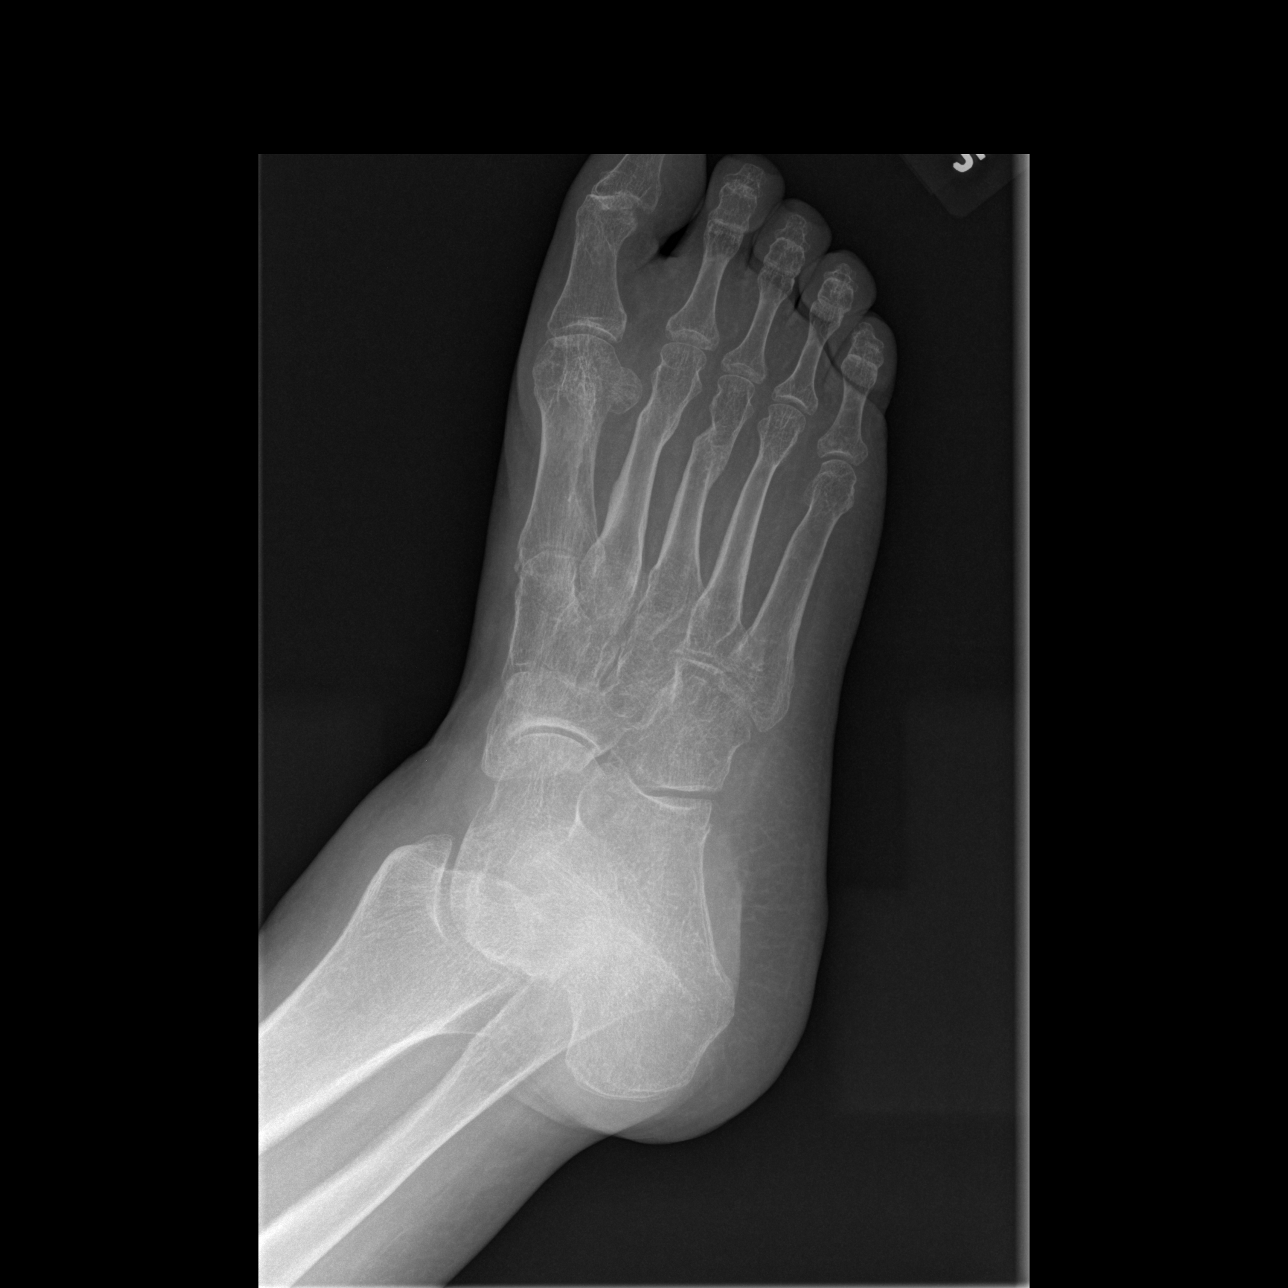

[t foot lat right]
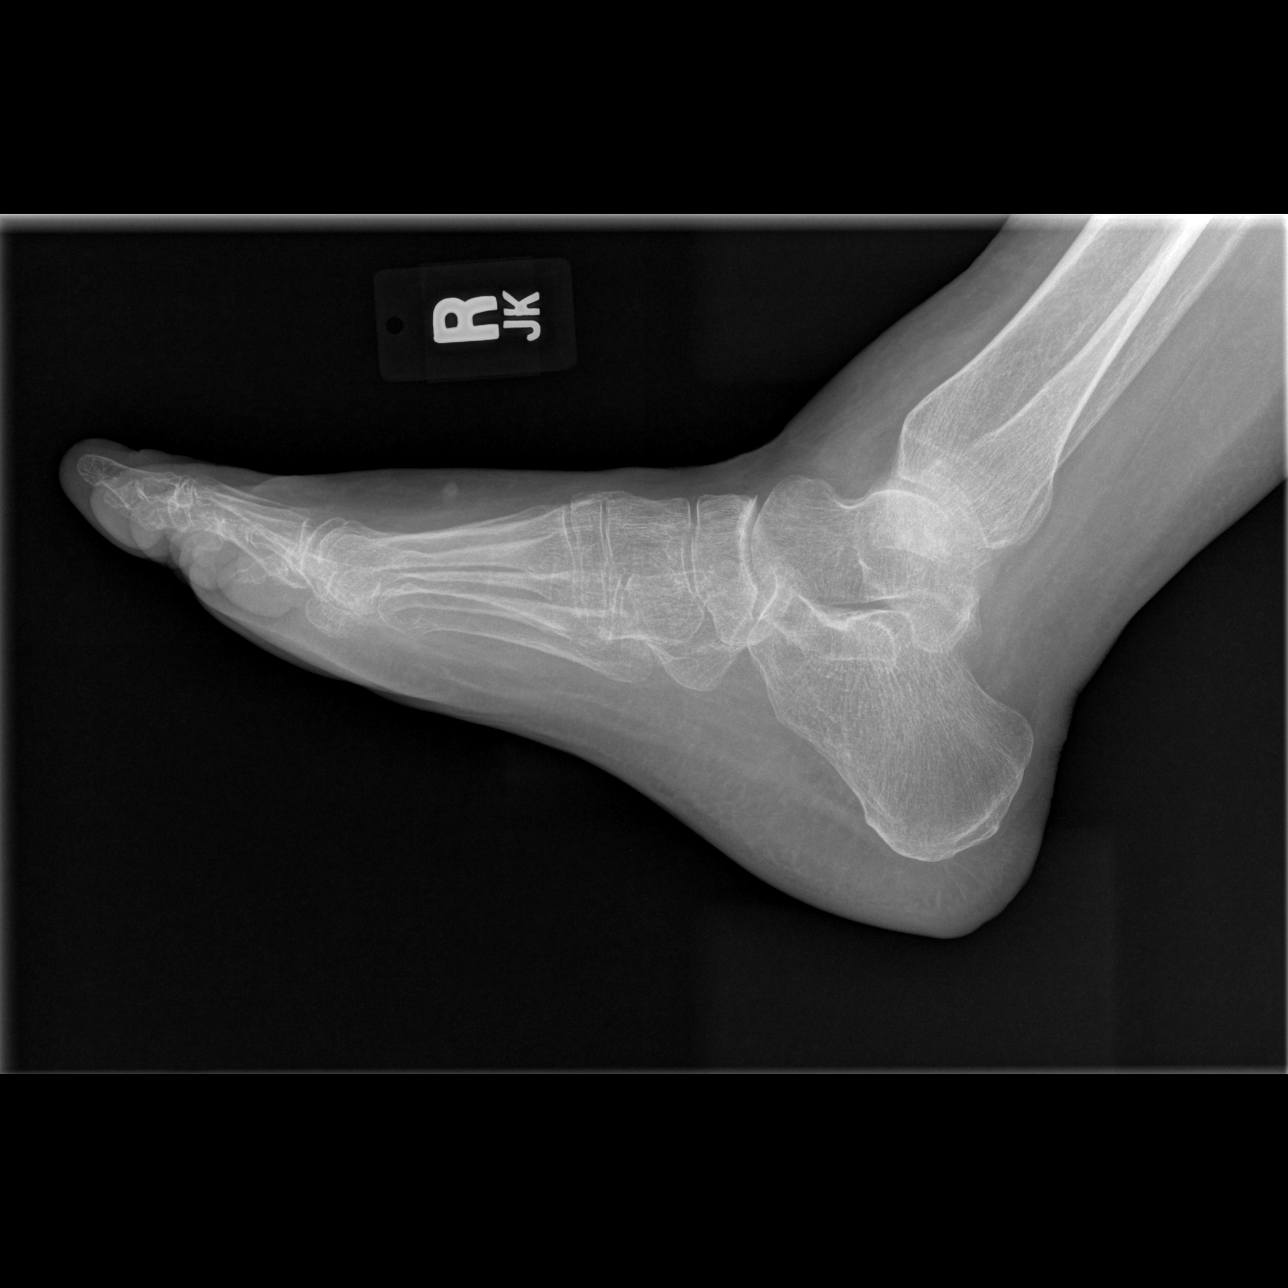

[3 of 3 positions shown; findings below may reference images not displayed]

FINDINGS: Right foot: The bones are mildly osteopenic. The phalanges are
intact. The interphalangeal joint spaces are mildly narrowed
diffusely. There is old deformity of the distal thirds of the shafts
of the second and third metatarsals consistent with the history
previous fractures. The MTP joints are preserved. The
tarsometatarsal joints and the intertarsal joints are unremarkable.
The talus and calcaneus are intact. There is mild soft tissue
swelling over the dorsum of the midfoot.

Right ankle: The ankle joint mortise is preserved. The talar dome is
intact. There is no acute malleolar fracture. There is soft tissue
swelling diffusely.
IMPRESSION: 1. There is no acute fracture of the bones of the right foot. There
is old deformity of the distal aspects of the shafts of the second
and third metatarsals. There is mild degenerative narrowing of the
interphalangeal joints diffusely.
2. There is no acute bony abnormality of the right ankle. There is
diffuse soft tissue swelling.
